# Patient Record
Sex: Male | Born: 1940
Health system: Southern US, Community
[De-identification: ages and names within clinical notes are randomized; demographics above are authoritative.]

## PROBLEM LIST (undated history)

## (undated) DIAGNOSIS — E781 Pure hyperglyceridemia: Secondary | ICD-10-CM

## (undated) DIAGNOSIS — F329 Major depressive disorder, single episode, unspecified: Secondary | ICD-10-CM

## (undated) DIAGNOSIS — F32A Depression, unspecified: Secondary | ICD-10-CM

## (undated) DIAGNOSIS — I10 Essential (primary) hypertension: Secondary | ICD-10-CM

## (undated) DIAGNOSIS — M549 Dorsalgia, unspecified: Secondary | ICD-10-CM

## (undated) DIAGNOSIS — G4730 Sleep apnea, unspecified: Secondary | ICD-10-CM

## (undated) DIAGNOSIS — R251 Tremor, unspecified: Secondary | ICD-10-CM

## (undated) DIAGNOSIS — E785 Hyperlipidemia, unspecified: Secondary | ICD-10-CM

## (undated) DIAGNOSIS — F0280 Dementia in other diseases classified elsewhere without behavioral disturbance: Secondary | ICD-10-CM

## (undated) DIAGNOSIS — G709 Myoneural disorder, unspecified: Secondary | ICD-10-CM

## (undated) DIAGNOSIS — G3183 Dementia with Lewy bodies: Secondary | ICD-10-CM

## (undated) DIAGNOSIS — M1990 Unspecified osteoarthritis, unspecified site: Secondary | ICD-10-CM

## (undated) DIAGNOSIS — C189 Malignant neoplasm of colon, unspecified: Secondary | ICD-10-CM

## (undated) DIAGNOSIS — G8929 Other chronic pain: Secondary | ICD-10-CM

## (undated) DIAGNOSIS — D649 Anemia, unspecified: Secondary | ICD-10-CM

## (undated) DIAGNOSIS — E039 Hypothyroidism, unspecified: Secondary | ICD-10-CM

## (undated) HISTORY — PX: APPENDECTOMY: SHX54

## (undated) HISTORY — DX: Tremor, unspecified: R25.1

## (undated) HISTORY — DX: Dementia in other diseases classified elsewhere, unspecified severity, without behavioral disturbance, psychotic disturbance, mood disturbance, and anxiety: F02.80

## (undated) HISTORY — PX: CATARACT EXTRACTION: SUR2

## (undated) HISTORY — PX: VASECTOMY: SHX75

## (undated) HISTORY — DX: Dementia with Lewy bodies: G31.83

## (undated) HISTORY — DX: Pure hyperglyceridemia: E78.1

## (undated) HISTORY — PX: KNEE SURGERY: SHX244

## (undated) HISTORY — PX: PORTACATH PLACEMENT: SHX2246

## (undated) HISTORY — DX: Hyperlipidemia, unspecified: E78.5

## (undated) HISTORY — PX: BACK SURGERY: SHX140

## (undated) HISTORY — PX: HERNIA REPAIR: SHX51

---

## 1997-09-18 DIAGNOSIS — C189 Malignant neoplasm of colon, unspecified: Secondary | ICD-10-CM

## 1997-09-18 HISTORY — DX: Malignant neoplasm of colon, unspecified: C18.9

## 1998-08-18 HISTORY — PX: COLON RESECTION: SHX5231

## 2012-06-25 ENCOUNTER — Other Ambulatory Visit (HOSPITAL_COMMUNITY): Payer: Self-pay | Admitting: Orthopaedic Surgery

## 2012-06-25 DIAGNOSIS — M25519 Pain in unspecified shoulder: Secondary | ICD-10-CM | POA: Diagnosis not present

## 2012-06-25 DIAGNOSIS — M25511 Pain in right shoulder: Secondary | ICD-10-CM

## 2012-06-26 ENCOUNTER — Ambulatory Visit (HOSPITAL_COMMUNITY)
Admission: RE | Admit: 2012-06-26 | Discharge: 2012-06-26 | Disposition: A | Discharge status: Home / Self Care | Payer: Managed Care, Other (non HMO) | Source: Ambulatory Visit | Attending: Orthopaedic Surgery | Admitting: Orthopaedic Surgery

## 2012-06-26 DIAGNOSIS — M249 Joint derangement, unspecified: Secondary | ICD-10-CM | POA: Insufficient documentation

## 2012-06-26 DIAGNOSIS — M24119 Other articular cartilage disorders, unspecified shoulder: Secondary | ICD-10-CM | POA: Diagnosis not present

## 2012-06-26 DIAGNOSIS — R937 Abnormal findings on diagnostic imaging of other parts of musculoskeletal system: Secondary | ICD-10-CM | POA: Insufficient documentation

## 2012-06-26 DIAGNOSIS — M25619 Stiffness of unspecified shoulder, not elsewhere classified: Secondary | ICD-10-CM | POA: Insufficient documentation

## 2012-06-26 DIAGNOSIS — M898X9 Other specified disorders of bone, unspecified site: Secondary | ICD-10-CM | POA: Insufficient documentation

## 2012-06-26 DIAGNOSIS — S4980XA Other specified injuries of shoulder and upper arm, unspecified arm, initial encounter: Secondary | ICD-10-CM | POA: Diagnosis not present

## 2012-06-26 DIAGNOSIS — M25519 Pain in unspecified shoulder: Secondary | ICD-10-CM | POA: Insufficient documentation

## 2012-06-26 DIAGNOSIS — S46909A Unspecified injury of unspecified muscle, fascia and tendon at shoulder and upper arm level, unspecified arm, initial encounter: Secondary | ICD-10-CM | POA: Diagnosis not present

## 2012-06-26 DIAGNOSIS — M25511 Pain in right shoulder: Secondary | ICD-10-CM

## 2012-06-28 DIAGNOSIS — M25519 Pain in unspecified shoulder: Secondary | ICD-10-CM | POA: Diagnosis not present

## 2012-07-03 DIAGNOSIS — M25519 Pain in unspecified shoulder: Secondary | ICD-10-CM | POA: Diagnosis not present

## 2012-07-08 DIAGNOSIS — M25519 Pain in unspecified shoulder: Secondary | ICD-10-CM | POA: Diagnosis not present

## 2012-07-10 DIAGNOSIS — M25519 Pain in unspecified shoulder: Secondary | ICD-10-CM | POA: Diagnosis not present

## 2012-07-15 DIAGNOSIS — M25519 Pain in unspecified shoulder: Secondary | ICD-10-CM | POA: Diagnosis not present

## 2012-07-17 DIAGNOSIS — M25519 Pain in unspecified shoulder: Secondary | ICD-10-CM | POA: Diagnosis not present

## 2012-07-26 DIAGNOSIS — M25519 Pain in unspecified shoulder: Secondary | ICD-10-CM | POA: Diagnosis not present

## 2012-08-02 DIAGNOSIS — Z23 Encounter for immunization: Secondary | ICD-10-CM | POA: Diagnosis not present

## 2012-08-23 DIAGNOSIS — M25519 Pain in unspecified shoulder: Secondary | ICD-10-CM | POA: Diagnosis not present

## 2012-09-20 DIAGNOSIS — M25519 Pain in unspecified shoulder: Secondary | ICD-10-CM | POA: Diagnosis not present

## 2012-10-20 ENCOUNTER — Emergency Department (HOSPITAL_COMMUNITY): Payer: Medicare Other

## 2012-10-20 ENCOUNTER — Emergency Department (INDEPENDENT_AMBULATORY_CARE_PROVIDER_SITE_OTHER)
Admission: EM | Admit: 2012-10-20 | Discharge: 2012-10-20 | Disposition: A | Discharge status: Nursing Home (Includes ICF) | Payer: Medicare Other | Source: Home / Self Care | Attending: Emergency Medicine | Admitting: Emergency Medicine

## 2012-10-20 ENCOUNTER — Encounter (HOSPITAL_COMMUNITY): Payer: Self-pay

## 2012-10-20 ENCOUNTER — Encounter (HOSPITAL_COMMUNITY): Payer: Self-pay | Admitting: Emergency Medicine

## 2012-10-20 ENCOUNTER — Emergency Department (HOSPITAL_COMMUNITY)
Admission: EM | Admit: 2012-10-20 | Discharge: 2012-10-20 | Disposition: A | Discharge status: Home / Self Care | Payer: Medicare Other | Attending: Emergency Medicine | Admitting: Emergency Medicine

## 2012-10-20 DIAGNOSIS — G479 Sleep disorder, unspecified: Secondary | ICD-10-CM | POA: Diagnosis not present

## 2012-10-20 DIAGNOSIS — Z9089 Acquired absence of other organs: Secondary | ICD-10-CM | POA: Insufficient documentation

## 2012-10-20 DIAGNOSIS — R635 Abnormal weight gain: Secondary | ICD-10-CM | POA: Insufficient documentation

## 2012-10-20 DIAGNOSIS — R141 Gas pain: Secondary | ICD-10-CM | POA: Diagnosis not present

## 2012-10-20 DIAGNOSIS — R143 Flatulence: Secondary | ICD-10-CM

## 2012-10-20 DIAGNOSIS — I700 Atherosclerosis of aorta: Secondary | ICD-10-CM | POA: Diagnosis not present

## 2012-10-20 DIAGNOSIS — R109 Unspecified abdominal pain: Secondary | ICD-10-CM

## 2012-10-20 DIAGNOSIS — K5900 Constipation, unspecified: Secondary | ICD-10-CM

## 2012-10-20 DIAGNOSIS — I10 Essential (primary) hypertension: Secondary | ICD-10-CM | POA: Insufficient documentation

## 2012-10-20 DIAGNOSIS — Z85038 Personal history of other malignant neoplasm of large intestine: Secondary | ICD-10-CM | POA: Diagnosis not present

## 2012-10-20 DIAGNOSIS — R0602 Shortness of breath: Secondary | ICD-10-CM | POA: Insufficient documentation

## 2012-10-20 DIAGNOSIS — R140 Abdominal distension (gaseous): Secondary | ICD-10-CM

## 2012-10-20 DIAGNOSIS — R142 Eructation: Secondary | ICD-10-CM | POA: Diagnosis not present

## 2012-10-20 HISTORY — DX: Malignant neoplasm of colon, unspecified: C18.9

## 2012-10-20 HISTORY — DX: Essential (primary) hypertension: I10

## 2012-10-20 LAB — COMPREHENSIVE METABOLIC PANEL
AST: 29 U/L (ref 0–37)
Albumin: 4.7 g/dL (ref 3.5–5.2)
BUN: 12 mg/dL (ref 6–23)
Calcium: 9.7 mg/dL (ref 8.4–10.5)
Creatinine, Ser: 0.7 mg/dL (ref 0.50–1.35)
Total Protein: 8 g/dL (ref 6.0–8.3)

## 2012-10-20 LAB — CBC WITH DIFFERENTIAL/PLATELET
Basophils Absolute: 0.1 10*3/uL (ref 0.0–0.1)
Basophils Relative: 1 % (ref 0–1)
Eosinophils Absolute: 0.1 10*3/uL (ref 0.0–0.7)
Eosinophils Relative: 3 % (ref 0–5)
HCT: 36.7 % — ABNORMAL LOW (ref 39.0–52.0)
MCH: 30.6 pg (ref 26.0–34.0)
MCHC: 35.1 g/dL (ref 30.0–36.0)
MCV: 87.2 fL (ref 78.0–100.0)
Monocytes Absolute: 0.2 10*3/uL (ref 0.1–1.0)
Monocytes Relative: 5 % (ref 3–12)
Neutro Abs: 2.5 10*3/uL (ref 1.7–7.7)
RDW: 13.4 % (ref 11.5–15.5)

## 2012-10-20 LAB — PRO B NATRIURETIC PEPTIDE: Pro B Natriuretic peptide (BNP): 125.4 pg/mL — ABNORMAL HIGH (ref 0–125)

## 2012-10-20 MED ORDER — POLYETHYLENE GLYCOL 3350 17 GM/SCOOP PO POWD: 17.0000 g | Freq: Every day | ORAL | Status: DC

## 2012-10-20 MED ORDER — IOHEXOL 300 MG/ML  SOLN
50.0000 mL | Freq: Once | INTRAMUSCULAR | Status: AC | PRN
  Administered 2012-10-20: 50 mL via ORAL

## 2012-10-20 MED ORDER — IOHEXOL 300 MG/ML  SOLN
100.0000 mL | Freq: Once | INTRAMUSCULAR | Status: AC | PRN
  Administered 2012-10-20: 100 mL via INTRAVENOUS

## 2012-10-20 MED ORDER — DICYCLOMINE HCL 20 MG PO TABS: 20.0000 mg | ORAL_TABLET | Freq: Two times a day (BID) | ORAL | Status: DC

## 2012-10-20 NOTE — ED Provider Notes (Addendum)
History     CSN: [id number]  Arrival date & time 10/20/12  1227   First MD Initiated Contact with Patient 10/20/12 1236      Chief Complaint  Patient presents with  . Abdominal Pain    (Consider location/radiation/quality/duration/timing/severity/associated sxs/prior treatment) HPI Comments: Patient presents to urgent care this afternoon complaining of ongoing abdominal pain and distention that has been getting worse in the last 2 months. Patient describes that he thinks he has gained approximately 20 pounds in the last 2 months. And is now having difficulty breathing when he lays flat and at night. Patient was diagnosed with colon cancer in 2000 requiring surgery and chemotherapy and admits that have not followup in the last few years as it Dr. told him to do so. Patient denies any nausea vomiting or diarrheas or changes in appetite or bowel movements. " My abdomen has more than doubling in size since in the last 2 months, and now is worse".   Patient is a 72 y.o. male presenting with abdominal pain. The history is provided by the patient and the spouse.  Abdominal Pain The primary symptoms of the illness include abdominal pain and shortness of breath. The primary symptoms of the illness do not include fever, fatigue, nausea, vomiting, hematochezia or dysuria. The onset of the illness was gradual. The problem has been gradually worsening.  The illness is associated with awakening from sleep (Positional dyspnea). The patient has not had a change in bowel habit. Symptoms associated with the illness do not include chills, anorexia or diaphoresis.    History reviewed. No pertinent past medical history.  History reviewed. No pertinent past surgical history.  No family history on file.  History  Substance Use Topics  . Smoking status: Not on file  . Smokeless tobacco: Not on file  . Alcohol Use: Not on file      Review of Systems  Constitutional: Positive for activity change and  unexpected weight change. Negative for fever, chills, diaphoresis, appetite change and fatigue.  Respiratory: Positive for shortness of breath. Negative for cough, choking, chest tightness, wheezing and stridor.   Cardiovascular: Negative for chest pain.  Gastrointestinal: Positive for abdominal pain. Negative for nausea, vomiting, hematochezia and anorexia.  Genitourinary: Negative for dysuria.  Skin: Negative for rash.  Neurological: Negative for dizziness, light-headedness and headaches.    Allergies  Review of patient's allergies indicates no known allergies.  Home Medications  No current outpatient prescriptions on file.  BP 201/84  Pulse 60  Temp 97.9 F (36.6 C) (Oral)  Resp 18  SpO2 100%  Physical Exam  Nursing note and vitals reviewed. Constitutional: He appears distressed.  Eyes: Conjunctivae normal are normal. Pupils are equal, round, and reactive to light. No scleral icterus.  Neck: Neck supple.  Cardiovascular: Normal rate.  Exam reveals no gallop and no friction rub.   No murmur heard. Pulmonary/Chest: Effort normal and breath sounds normal.  Abdominal: He exhibits distension. He exhibits no mass. There is no hepatosplenomegaly, splenomegaly or hepatomegaly. There is tenderness. There is no rigidity, no rebound, no guarding, no CVA tenderness, no tenderness at McBurney's point and negative Murphy's sign.    Neurological: He is alert.  Skin: No rash noted. No erythema.    ED Course  Procedures (including critical care time)  Labs Reviewed - No data to display No results found.   1. Abdominal pain   2. Abdominal distension       MDM   72 year old male, presents to urgent care  symptomatic for 2 months. Includes weight gain of approximately 20 pounds and abdominal distention. Patient is in discomfort, and is having some mechanical dyspnea exerted by abdominal distention. Etiology of such are several, including partial intestinal obstruction ( previous  surgery), ascites secondary to recurrence he of colon cancer?  Patient will be transferred to the emergency department to be consider for further evaluation and perhaps abdominal imaging.        Jimmie Molly, MD 10/20/12 1505  Jimmie Molly, MD 10/20/12 [phone]

## 2012-10-20 NOTE — ED Notes (Signed)
Pt sent from urgent care for further eval of abdominal distension and weight gain. Pt reports symptoms ongoing x 2-3 months. Pt denies pain, nausea or vomiting. Pt gained about 52-58 pounds in 6 months.

## 2012-10-20 NOTE — ED Notes (Signed)
Pt had colon resection and chemotherapy (stage 3 cancer) in 1999.  Followed up with colonoscopies for 5 years afterward/no follow up since 2004

## 2012-10-20 NOTE — ED Notes (Signed)
C/o worsening abdominal pain, sx started approx 2 months ago , states that he has difficulty breathing at night

## 2012-10-20 NOTE — ED Provider Notes (Signed)
History  This chart was scribed for Loren Racer, MD by Shari Heritage, ED Scribe. The patient was seen in room CD05C/CD05C. Patient's care was started at 1609.  CSN: [id number]  Arrival date & time 10/20/12  1545   First MD Initiated Contact with Patient 10/20/12 1609      Chief Complaint  Patient presents with  . Weight Gain  . Bloated     Patient is a 72 y.o. male presenting with abdominal pain. The history is provided by the patient. No language interpreter was used.  Abdominal Pain Primary symptoms comment: Abdominal distension The current episode started more than 2 days ago. The onset of the illness was gradual. The problem has not changed since onset.    HPI Comments: John Molina is a 72 y.o. male who presents to the Emergency Department complaining of abdominal distension onset 2-3 months ago.Patient states that he has gained about 50 lbs in the past 6 months. He has a history of colon cancer and colon resection. He states that this procedure was performed in [place] and he does not have a gastroenterologist or oncologist in the area as he just moved here recently. Patient was seen earlier today at [facility] about this problem and was transferred for further evaluation and abdominal imaging. Patient also reports shortness of breath at night. He states the he is having increased discomfort and difficulty sleeping at night. Patient denies nausea, vomiting, diarrhea, constipation, fever or chills. Other medical history includes hypertension.  Past Medical History  Diagnosis Date  . Hypertension   . Colon cancer     Past Surgical History  Procedure Date  . Colon resection 08/1998  . Hernia repair   . Appendectomy   . Knee surgery   . Vasectomy     No family history on file.  History  Substance Use Topics  . Smoking status: Never Smoker   . Smokeless tobacco: Not on file  . Alcohol Use: No      Review of Systems  Gastrointestinal: Positive for abdominal  distention.  All other systems reviewed and are negative.    Allergies  Review of patient's allergies indicates no known allergies.  Home Medications   Current Outpatient Rx  Name  Route  Sig  Dispense  Refill  . NAPROXEN 250 MG PO TABS   Oral   Take 250 mg by mouth 2 (two) times daily as needed. For pain         . DICYCLOMINE HCL 20 MG PO TABS   Oral   Take 1 tablet (20 mg total) by mouth 2 (two) times daily.   20 tablet   0   . POLYETHYLENE GLYCOL 3350 PO POWD   Oral   Take 17 g by mouth daily.   255 g   0     Triage Vitals: BP 186/76  Pulse 56  Temp 97.4 F (36.3 C) (Oral)  Resp 18  Wt 185 lb (83.915 kg)  SpO2 99%  Physical Exam  Constitutional: He is oriented to person, place, and time. He appears well-developed and well-nourished.  HENT:  Head: Normocephalic and atraumatic.  Mouth/Throat: Oropharynx is clear and moist.  Eyes: Conjunctivae normal and EOM are normal. Pupils are equal, round, and reactive to light.  Neck: Normal range of motion. Neck supple.  Cardiovascular: Normal rate, regular rhythm and normal heart sounds.   Pulmonary/Chest: Effort normal and breath sounds normal. No respiratory distress. He has no wheezes. He has no rales.  Abdominal: Bowel  sounds are normal. He exhibits distension.       No focal tenderness to palpation.  Musculoskeletal: Normal range of motion. He exhibits no edema and no tenderness.       No pedal edema.  Neurological: He is alert and oriented to person, place, and time.  Skin: Skin is warm and dry.    ED Course  Procedures (including critical care time) DIAGNOSTIC STUDIES: Oxygen Saturation is 99% on room air, normal by my interpretation.    COORDINATION OF CARE: 4:37 PM- Patient informed of current plan for treatment and evaluation and agrees with plan at this time.    Labs Reviewed  CBC WITH DIFFERENTIAL - Abnormal; Notable for the following:    RBC 4.21 (*)     Hemoglobin 12.9 (*)     HCT 36.7 (*)      All other components within normal limits  PRO B NATRIURETIC PEPTIDE - Abnormal; Notable for the following:    Pro B Natriuretic peptide (BNP) 125.4 (*)     All other components within normal limits  COMPREHENSIVE METABOLIC PANEL  LIPASE, BLOOD    Dg Chest 2 View  10/20/2012  *RADIOLOGY REPORT*  Clinical Data: Shortness of breath, hypertension, history of colon cancer  CHEST - 2 VIEW  Comparison: None.  Findings:  Normal cardiac silhouette and mediastinal contours.  There is minimal perihilar interstitial thickening.  No focal airspace opacities.  Nodular opacity overlying the left lower lung is favored to represent a nipple shadow.  No focal airspace opacities. No pleural effusion or pneumothorax.  DISH within the thoracic spine is suspected.  IMPRESSION: Findings suggestive of airways disease/bronchitis.  No focal airspace opacities to suggest pneumonia.   Original Report Authenticated By: Tacey Ruiz, MD    Ct Abdomen Pelvis W Contrast  10/20/2012  *RADIOLOGY REPORT*  Clinical Data: Abdominal distention.  Weight gain.  History of colon cancer with colon resection.  Appendectomy.  CT ABDOMEN AND PELVIS WITH CONTRAST  Technique:  Multidetector CT imaging of the abdomen and pelvis was performed following the standard protocol during bolus administration of intravenous contrast.  Contrast: OMNIPAQUE IOHEXOL 300 MG/ML  SOLN  Comparison: None.  Findings: Lung Bases: Dependent atelectasis.  Liver:  Normal.  Spleen:  Normal.  Gallbladder:  Normal.  Common bile duct:  Normal.  Pancreas:  Normal.  Adrenal glands:  Normal.  Kidneys:  Normal enhancement and excretion of contrast.  5 mm right inferior pole low density lesion likely represents a tiny renal cyst.  The ureters appear within normal limits.  Stomach:  Normal.  Small bowel:  Normal.  No obstruction.  No mesenteric adenopathy. No free air or free fluid.  Colon:   Cecum and ascending colon appear normal.  No right lower quadrant inflammatory  changes.  Colon is distended with stool. Surgical staple line present at the rectosigmoid junction.  Pelvic Genitourinary:  Normal.  No free fluid.  Bones:  Normal.  Lumbar spondylosis and scattered Schmorl's nodes.  Vasculature: Mild abdominal aortic atherosclerosis without aneurysm.  IMPRESSION:  1.  No acute abnormality. 2.  Partial sigmoid resection with staple line at the junction of the rectum and sigmoid.   Original Report Authenticated By: Andreas Newport, M.D.      1. Constipation       MDM  I personally performed the services described in this documentation, which was scribed in my presence. The recorded information has been reviewed and is accurate.    Loren Racer, MD 10/20/12 [phone]

## 2012-10-21 DIAGNOSIS — M25519 Pain in unspecified shoulder: Secondary | ICD-10-CM | POA: Diagnosis not present

## 2012-11-04 ENCOUNTER — Encounter (HOSPITAL_COMMUNITY)
Admission: RE | Admit: 2012-11-04 | Discharge: 2012-11-04 | Disposition: A | Discharge status: Home / Self Care | Payer: Medicare Other | Source: Ambulatory Visit | Attending: Orthopaedic Surgery | Admitting: Orthopaedic Surgery

## 2012-11-04 ENCOUNTER — Encounter (HOSPITAL_COMMUNITY): Payer: Self-pay | Admitting: Pharmacy Technician

## 2012-11-04 ENCOUNTER — Encounter (HOSPITAL_COMMUNITY): Payer: Self-pay

## 2012-11-04 DIAGNOSIS — S43429A Sprain of unspecified rotator cuff capsule, initial encounter: Secondary | ICD-10-CM | POA: Diagnosis not present

## 2012-11-04 DIAGNOSIS — I10 Essential (primary) hypertension: Secondary | ICD-10-CM | POA: Diagnosis not present

## 2012-11-04 DIAGNOSIS — Z01810 Encounter for preprocedural cardiovascular examination: Secondary | ICD-10-CM | POA: Diagnosis not present

## 2012-11-04 DIAGNOSIS — Z01812 Encounter for preprocedural laboratory examination: Secondary | ICD-10-CM | POA: Diagnosis not present

## 2012-11-04 DIAGNOSIS — Z23 Encounter for immunization: Secondary | ICD-10-CM | POA: Diagnosis not present

## 2012-11-04 DIAGNOSIS — M25519 Pain in unspecified shoulder: Secondary | ICD-10-CM | POA: Diagnosis not present

## 2012-11-04 HISTORY — DX: Unspecified osteoarthritis, unspecified site: M19.90

## 2012-11-04 HISTORY — DX: Dorsalgia, unspecified: M54.9

## 2012-11-04 HISTORY — DX: Anemia, unspecified: D64.9

## 2012-11-04 HISTORY — DX: Other chronic pain: G89.29

## 2012-11-04 LAB — URINALYSIS, ROUTINE W REFLEX MICROSCOPIC
Bilirubin Urine: NEGATIVE
Glucose, UA: NEGATIVE mg/dL
Hgb urine dipstick: NEGATIVE
Specific Gravity, Urine: >1.03 — ABNORMAL HIGH (ref 1.005–1.030)
Urobilinogen, UA: 0.2 mg/dL (ref 0.0–1.0)

## 2012-11-04 LAB — SURGICAL PCR SCREEN
MRSA, PCR: NEGATIVE
Staphylococcus aureus: POSITIVE — AB

## 2012-11-04 LAB — URINE MICROSCOPIC-ADD ON

## 2012-11-04 NOTE — Patient Instructions (Addendum)
John Molina  11/04/2012   Your procedure is scheduled on:   11/07/2012  Report to [facility] at  615  AM.  Call this number if you have problems the morning of surgery: [phone]   Remember:   Do not eat food or drink liquids after midnight.   Take these medicines the morning of surgery with A SIP OF WATER: none   Do not wear jewelry, make-up or nail polish.  Do not wear lotions, powders, or perfumes.   Do not shave 48 hours prior to surgery. Men may shave face and neck.  Do not bring valuables to the hospital.  Contacts, dentures or bridgework may not be worn into surgery.  Leave suitcase in the car. After surgery it may be brought to your room.  For patients admitted to the hospital, checkout time is 11:00 AM the day of discharge.   Patients discharged the day of surgery will not be allowed to drive home.  Name and phone number of your driver: family  Special Instructions: Shower using CHG 2 nights before surgery and the night before surgery.  If you shower the day of surgery use CHG.  Use special wash - you have one bottle of CHG for all showers.  You should use approximately 1/3 of the bottle for each shower.   Please read over the following fact sheets that you were given: Pain Booklet, Coughing and Deep Breathing, MRSA Information, Surgical Site Infection Prevention, Anesthesia Post-op Instructions and Care and Recovery After Surgery Rotator Cuff Injury The rotator cuff is the collective set of muscles and tendons that make up the stabilizing unit of your shoulder. This unit holds in the ball of the humerus (upper arm bone) in the socket of the scapula (shoulder blade). Injuries to this stabilizing unit most commonly come from sports or activities that cause the arm to be moved repeatedly over the head. Examples of this include throwing, weight lifting, swimming, racquet sports, or an injury such as falling on your arm. Chronic (longstanding) irritation of this unit can  cause inflammation (soreness), bursitis, and eventual damage to the tendons to the point of rupture (tear). An acute (sudden) injury of the rotator cuff can result in a partial or complete tear. You may need surgery with complete tears. Small or partial rotator cuff tears may be treated conservatively with temporary immobilization, exercises and rest. Physical therapy may be needed. HOME CARE INSTRUCTIONS   Apply ice to the injury for 15 to 20 minutes 3 to 4 times per day for the first 2 days. Put the ice in a plastic bag and place a towel between the bag of ice and your skin.  If you have a shoulder immobilizer (sling and straps), do not remove it for as long as directed by your caregiver or until you see a caregiver for a follow-up examination. If you need to remove it, move your arm as little as possible.  You may want to sleep on several pillows or in a recliner at night to lessen swelling and pain.  Only take over-the-counter or prescription medicines for pain, discomfort, or fever as directed by your caregiver.  Do simple hand squeezing exercises with a soft rubber ball to decrease hand swelling. SEEK MEDICAL CARE IF:   Pain in your shoulder increases or new pain or numbness develops in your arm, hand, or fingers.  Your hand or fingers are colder than your other hand. SEEK IMMEDIATE MEDICAL CARE IF:  Your arm, hand, or fingers are numb or tingling.  Your arm, hand, or fingers are increasingly swollen and painful, or turn white or blue. Document Released: 09/01/2000 Document Revised: 11/27/2011 Document Reviewed: 08/25/2008 [facility] Patient Information 2013 [place], [place]. PATIENT INSTRUCTIONS POST-ANESTHESIA  IMMEDIATELY FOLLOWING SURGERY:  Do not drive or operate machinery for the first twenty four hours after surgery.  Do not make any important decisions for twenty four hours after surgery or while taking narcotic pain medications or sedatives.  If you develop intractable nausea  and vomiting or a severe headache please notify your doctor immediately.  FOLLOW-UP:  Please make an appointment with your surgeon as instructed. You do not need to follow up with anesthesia unless specifically instructed to do so.  WOUND CARE INSTRUCTIONS (if applicable):  Keep a dry clean dressing on the anesthesia/puncture wound site if there is drainage.  Once the wound has quit draining you may leave it open to air.  Generally you should leave the bandage intact for twenty four hours unless there is drainage.  If the epidural site drains for more than 36-48 hours please call the anesthesia department.  QUESTIONS?:  Please feel free to call your physician or the hospital operator if you have any questions, and they will be happy to assist you.

## 2012-11-06 ENCOUNTER — Encounter (HOSPITAL_COMMUNITY): Payer: Self-pay | Admitting: Orthopaedic Surgery

## 2012-11-06 NOTE — OR Nursing (Signed)
Dr. Hilda Lias office called to remind him that we need H&P in morning.  Spoke with [employer].

## 2012-11-06 NOTE — H&P (Signed)
John Molina is an 72 y.o. male.   Chief Complaint: Right shoulder pain. HPI: He hurt his right shoulder using a jack at his home in late September 2013.  I first saw him in my office 06/25/12.  I was concerned about a rotator cuff tear.  He had a MRI done on 06/26/12 showing partial thickness articular surface tear of distal anterior supraspinatus tendon with moderated supraspinatus tendinopathy and edema.  He had subscapularis tendinopathy and prominent degenerative AC joint arthropathy and impingement on the supraspinatus tendon.  He had changes of the long head of the biceps tendon.  I informed him of the findings and he began a course of physical therapy and NSAIDs.  He improved for a while but the pain has returned and he is tired of it hurting all the time.  He would like surgery on the shoulder.  I have gone over the risks and imponderables of the procedure with him.  He understands that even after the surgery he may have some pain and some deficit.  He has history of chronic pain of the lower back and has been on oxycodone for some time.  He may need large doses of pain medicine for a short time post operative. He will need physical therapy again post operative.  He says he understands and accepts the procedure.  I plan to admit to observation status post operative to monitor his pain. Past Medical History  Diagnosis Date  . Hypertension   . Colon cancer   . Arthritis   . Hepatitis     Hep C  . Anemia   . Chronic back pain     Past Surgical History  Procedure Laterality Date  . Colon resection  08/1998  . Hernia repair    . Appendectomy    . Knee surgery    . Vasectomy    . Cataract extraction      bilateral    No family history on file. Social History:  reports that he has never smoked. He does not have any smokeless tobacco history on file. He reports that he does not drink alcohol or use illicit drugs.  Allergies: No Known Allergies  No prescriptions prior to admission     Results for orders placed during the hospital encounter of 11/04/12 (from the past 48 hour(s))  URINALYSIS, ROUTINE W REFLEX MICROSCOPIC     Status: Abnormal   Collection Time    11/04/12  1:41 PM      Result Value Range   Color, Urine YELLOW  YELLOW   APPearance CLEAR  CLEAR   Specific Gravity, Urine >1.030 (*) 1.005 - 1.030   pH 5.5  5.0 - 8.0   Glucose, UA NEGATIVE  NEGATIVE mg/dL   Hgb urine dipstick NEGATIVE  NEGATIVE   Bilirubin Urine NEGATIVE  NEGATIVE   Ketones, ur NEGATIVE  NEGATIVE mg/dL   Protein, ur 30 (*) NEGATIVE mg/dL   Urobilinogen, UA 0.2  0.0 - 1.0 mg/dL   Nitrite NEGATIVE  NEGATIVE   Leukocytes, UA NEGATIVE  NEGATIVE  SURGICAL PCR SCREEN     Status: Abnormal   Collection Time    11/04/12  1:41 PM      Result Value Range   MRSA, PCR NEGATIVE  NEGATIVE   Staphylococcus aureus POSITIVE (*) NEGATIVE   Comment:            The Xpert SA Assay (FDA     approved for NASAL specimens     in patients over  18 years of age),     is one component of     a comprehensive surveillance     program.  Test performance has     been validated by [employer] for patients greater     than or equal to 55 year old.     It is not intended     to diagnose infection nor to     guide or monitor treatment.     RESULT CALLED TO, READ BACK BY AND VERIFIED WITH:     LEFT MESSAGE FOR KIM FAINT AT 1808 ON 11/04/2012 BY BAUGHAM,M.  URINE MICROSCOPIC-ADD ON     Status: Abnormal   Collection Time    11/04/12  1:41 PM      Result Value Range   Squamous Epithelial / LPF RARE  RARE   WBC, UA 0-2  <3 WBC/hpf   Bacteria, UA RARE  RARE   Casts HYALINE CASTS (*) NEGATIVE   No results found.  Review of Systems  Gastrointestinal:       History of colon cancer.  Musculoskeletal: Positive for back pain (History of chronic back pain, two surgeries.  He is on chronic narcotics for this.) and joint pain (Pain right shoulder since the end of September 2013.).  All other systems reviewed  and are negative.    There were no vitals taken for this visit. Physical Exam  Constitutional: He is oriented to person, place, and time. He appears well-developed and well-nourished.  HENT:  Head: Normocephalic and atraumatic.  Eyes: Conjunctivae and EOM are normal. Pupils are equal, round, and reactive to light.  Neck: Neck supple.  Cardiovascular: Normal rate, regular rhythm, normal heart sounds and intact distal pulses.   Respiratory: Effort normal and breath sounds normal.  GI: Soft. Bowel sounds are normal.  Musculoskeletal: He exhibits tenderness (Pain of the right shoulder.  He has pain in the extremes, more with overhead use and abduction.  Range is full but painful.  He has slight crepitus.  He has no numbness.).  Neurological: He is alert and oriented to person, place, and time. He has normal reflexes.  Skin: Skin is warm and dry.  Psychiatric: He has a normal mood and affect. His behavior is normal. Judgment and thought content normal.     Assessment/Plan Rotator cuff tear of the right shoulder.  For open repair. Admit to observation post operative.   John Molina 11/06/2012, 11:31 AM

## 2012-11-07 ENCOUNTER — Ambulatory Visit (HOSPITAL_COMMUNITY): Payer: Medicare Other | Admitting: Anesthesiology

## 2012-11-07 ENCOUNTER — Encounter (HOSPITAL_COMMUNITY): Payer: Self-pay | Admitting: Anesthesiology

## 2012-11-07 ENCOUNTER — Encounter (HOSPITAL_COMMUNITY)
Admission: RE | Disposition: A | Discharge status: Home / Self Care | Payer: Self-pay | Source: Ambulatory Visit | Attending: Orthopaedic Surgery

## 2012-11-07 ENCOUNTER — Encounter (HOSPITAL_COMMUNITY): Payer: Self-pay | Admitting: *Deleted

## 2012-11-07 ENCOUNTER — Observation Stay (HOSPITAL_COMMUNITY)
Admission: RE | Admit: 2012-11-07 | Discharge: 2012-11-09 | Disposition: A | Discharge status: Home / Self Care | Payer: Medicare Other | Source: Ambulatory Visit | Attending: Orthopaedic Surgery | Admitting: Orthopaedic Surgery

## 2012-11-07 DIAGNOSIS — D1600 Benign neoplasm of scapula and long bones of unspecified upper limb: Secondary | ICD-10-CM | POA: Diagnosis not present

## 2012-11-07 DIAGNOSIS — Z01812 Encounter for preprocedural laboratory examination: Secondary | ICD-10-CM | POA: Insufficient documentation

## 2012-11-07 DIAGNOSIS — Z23 Encounter for immunization: Secondary | ICD-10-CM | POA: Diagnosis not present

## 2012-11-07 DIAGNOSIS — X58XXXA Exposure to other specified factors, initial encounter: Secondary | ICD-10-CM | POA: Insufficient documentation

## 2012-11-07 DIAGNOSIS — Z01810 Encounter for preprocedural cardiovascular examination: Secondary | ICD-10-CM | POA: Diagnosis not present

## 2012-11-07 DIAGNOSIS — S43429A Sprain of unspecified rotator cuff capsule, initial encounter: Principal | ICD-10-CM | POA: Insufficient documentation

## 2012-11-07 DIAGNOSIS — D2110 Benign neoplasm of connective and other soft tissue of unspecified upper limb, including shoulder: Secondary | ICD-10-CM | POA: Diagnosis not present

## 2012-11-07 DIAGNOSIS — M75101 Unspecified rotator cuff tear or rupture of right shoulder, not specified as traumatic: Secondary | ICD-10-CM

## 2012-11-07 DIAGNOSIS — M75120 Complete rotator cuff tear or rupture of unspecified shoulder, not specified as traumatic: Secondary | ICD-10-CM | POA: Diagnosis not present

## 2012-11-07 DIAGNOSIS — M12811 Other specific arthropathies, not elsewhere classified, right shoulder: Secondary | ICD-10-CM

## 2012-11-07 DIAGNOSIS — I10 Essential (primary) hypertension: Secondary | ICD-10-CM | POA: Insufficient documentation

## 2012-11-07 HISTORY — PX: SHOULDER OPEN ROTATOR CUFF REPAIR: SHX2407

## 2012-11-07 HISTORY — PX: SHOULDER ACROMIOPLASTY: SHX6093

## 2012-11-07 SURGERY — REPAIR, ROTATOR CUFF, OPEN
Anesthesia: General | Site: Shoulder | Laterality: Right | Wound class: Clean

## 2012-11-07 MED ORDER — HYDROMORPHONE HCL PF 1 MG/ML IJ SOLN
INTRAMUSCULAR | Status: AC
  Filled 2012-11-07: qty 1

## 2012-11-07 MED ORDER — FENTANYL CITRATE 0.05 MG/ML IJ SOLN
INTRAMUSCULAR | Status: DC | PRN
  Administered 2012-11-07 (×2): 50 ug via INTRAVENOUS
  Administered 2012-11-07: 100 ug via INTRAVENOUS
  Administered 2012-11-07: 50 ug via INTRAVENOUS

## 2012-11-07 MED ORDER — DIPHENHYDRAMINE HCL 12.5 MG/5ML PO ELIX: 12.5000 mg | ORAL_SOLUTION | Freq: Four times a day (QID) | ORAL | Status: DC | PRN

## 2012-11-07 MED ORDER — ONDANSETRON HCL 4 MG/2ML IJ SOLN
4.0000 mg | Freq: Once | INTRAMUSCULAR | Status: AC
  Administered 2012-11-07: 4 mg via INTRAVENOUS

## 2012-11-07 MED ORDER — ZOLPIDEM TARTRATE 5 MG PO TABS
5.0000 mg | ORAL_TABLET | Freq: Every day | ORAL | Status: DC
  Administered 2012-11-07 – 2012-11-08 (×2): 5 mg via ORAL
  Filled 2012-11-07 (×2): qty 1

## 2012-11-07 MED ORDER — ROCURONIUM BROMIDE 50 MG/5ML IV SOLN
INTRAVENOUS | Status: AC
  Filled 2012-11-07: qty 1

## 2012-11-07 MED ORDER — SUFENTANIL CITRATE 50 MCG/ML IV SOLN
INTRAVENOUS | Status: DC | PRN
  Administered 2012-11-07: 5 ug via INTRAVENOUS
  Administered 2012-11-07: 10 ug via INTRAVENOUS
  Administered 2012-11-07: 5 ug via INTRAVENOUS
  Administered 2012-11-07 (×2): 10 ug via INTRAVENOUS
  Administered 2012-11-07 (×2): 5 ug via INTRAVENOUS

## 2012-11-07 MED ORDER — NEOSTIGMINE METHYLSULFATE 1 MG/ML IJ SOLN
INTRAMUSCULAR | Status: DC | PRN
  Administered 2012-11-07: 2 mg via INTRAVENOUS

## 2012-11-07 MED ORDER — MIDAZOLAM HCL 2 MG/2ML IJ SOLN
1.0000 mg | INTRAMUSCULAR | Status: DC | PRN
  Administered 2012-11-07: 2 mg via INTRAVENOUS

## 2012-11-07 MED ORDER — ENOXAPARIN SODIUM 40 MG/0.4ML ~~LOC~~ SOLN
40.0000 mg | SUBCUTANEOUS | Status: DC
  Administered 2012-11-08 – 2012-11-09 (×2): 40 mg via SUBCUTANEOUS
  Filled 2012-11-07 (×2): qty 0.4

## 2012-11-07 MED ORDER — ALBUTEROL SULFATE (5 MG/ML) 0.5% IN NEBU
2.5000 mg | INHALATION_SOLUTION | Freq: Four times a day (QID) | RESPIRATORY_TRACT | Status: DC
  Administered 2012-11-07 – 2012-11-08 (×2): 2.5 mg via RESPIRATORY_TRACT
  Filled 2012-11-07 (×2): qty 0.5

## 2012-11-07 MED ORDER — LIDOCAINE HCL (CARDIAC) 10 MG/ML IV SOLN
INTRAVENOUS | Status: DC | PRN
  Administered 2012-11-07: 20 mg via INTRAVENOUS

## 2012-11-07 MED ORDER — GLYCOPYRROLATE 0.2 MG/ML IJ SOLN
INTRAMUSCULAR | Status: DC | PRN
  Administered 2012-11-07: 0.4 mg via INTRAVENOUS

## 2012-11-07 MED ORDER — HYDROMORPHONE 0.3 MG/ML IV SOLN
INTRAVENOUS | Status: DC
  Administered 2012-11-07: 12:00:00 via INTRAVENOUS
  Administered 2012-11-07: 2.21 mg via INTRAVENOUS
  Administered 2012-11-07: 6 mg via INTRAVENOUS
  Administered 2012-11-07: 19:00:00 via INTRAVENOUS
  Administered 2012-11-08: 1.5 mg via INTRAVENOUS
  Administered 2012-11-08: 4.5 mg via INTRAVENOUS
  Filled 2012-11-07 (×3): qty 25

## 2012-11-07 MED ORDER — PROPOFOL 10 MG/ML IV BOLUS
INTRAVENOUS | Status: DC | PRN
  Administered 2012-11-07: 150 mg via INTRAVENOUS

## 2012-11-07 MED ORDER — LACTATED RINGERS IV SOLN
INTRAVENOUS | Status: DC
  Administered 2012-11-07: 1000 mL via INTRAVENOUS
  Administered 2012-11-07: 09:00:00 via INTRAVENOUS

## 2012-11-07 MED ORDER — ONDANSETRON HCL 4 MG/2ML IJ SOLN
4.0000 mg | Freq: Four times a day (QID) | INTRAMUSCULAR | Status: DC | PRN
  Administered 2012-11-08: 4 mg via INTRAVENOUS
  Filled 2012-11-07: qty 2

## 2012-11-07 MED ORDER — OXYMETAZOLINE HCL 0.05 % NA SOLN
2.0000 | Freq: Two times a day (BID) | NASAL | Status: DC
  Administered 2012-11-07 – 2012-11-08 (×3): 2 via NASAL
  Filled 2012-11-07: qty 15

## 2012-11-07 MED ORDER — HYDROMORPHONE HCL PF 1 MG/ML IJ SOLN
0.2500 mg | INTRAMUSCULAR | Status: DC | PRN
  Administered 2012-11-07 (×2): 0.5 mg via INTRAVENOUS

## 2012-11-07 MED ORDER — ACETAMINOPHEN 325 MG PO TABS
650.0000 mg | ORAL_TABLET | Freq: Four times a day (QID) | ORAL | Status: DC | PRN
  Administered 2012-11-08: 650 mg via ORAL
  Filled 2012-11-07: qty 2

## 2012-11-07 MED ORDER — POLYETHYLENE GLYCOL 3350 17 G PO PACK
17.0000 g | PACK | Freq: Every day | ORAL | Status: DC
  Administered 2012-11-08 – 2012-11-09 (×2): 17 g via ORAL
  Filled 2012-11-07 (×2): qty 1

## 2012-11-07 MED ORDER — MAGNESIUM HYDROXIDE 400 MG/5ML PO SUSP: 30.0000 mL | Freq: Every day | ORAL | Status: DC | PRN

## 2012-11-07 MED ORDER — KETOROLAC TROMETHAMINE 30 MG/ML IJ SOLN
INTRAMUSCULAR | Status: AC
  Filled 2012-11-07: qty 1

## 2012-11-07 MED ORDER — DIPHENHYDRAMINE HCL 50 MG/ML IJ SOLN: 12.5000 mg | Freq: Four times a day (QID) | INTRAMUSCULAR | Status: DC | PRN

## 2012-11-07 MED ORDER — PNEUMOCOCCAL VAC POLYVALENT 25 MCG/0.5ML IJ INJ
0.5000 mL | INJECTION | INTRAMUSCULAR | Status: AC
  Administered 2012-11-08: 0.5 mL via INTRAMUSCULAR
  Filled 2012-11-07: qty 0.5

## 2012-11-07 MED ORDER — CEFAZOLIN SODIUM-DEXTROSE 2-3 GM-% IV SOLR
2.0000 g | Freq: Once | INTRAVENOUS | Status: AC
  Administered 2012-11-07: 2 g via INTRAVENOUS

## 2012-11-07 MED ORDER — LIDOCAINE HCL (PF) 1 % IJ SOLN
INTRAMUSCULAR | Status: AC
  Filled 2012-11-07: qty 5

## 2012-11-07 MED ORDER — FENTANYL CITRATE 0.05 MG/ML IJ SOLN
INTRAMUSCULAR | Status: AC
  Filled 2012-11-07: qty 5

## 2012-11-07 MED ORDER — ROCURONIUM BROMIDE 100 MG/10ML IV SOLN
INTRAVENOUS | Status: DC | PRN
  Administered 2012-11-07: 10 mg via INTRAVENOUS
  Administered 2012-11-07: 40 mg via INTRAVENOUS

## 2012-11-07 MED ORDER — EPHEDRINE SULFATE 50 MG/ML IJ SOLN
INTRAMUSCULAR | Status: DC | PRN
  Administered 2012-11-07 (×2): 5 mg via INTRAVENOUS

## 2012-11-07 MED ORDER — ACETAMINOPHEN 10 MG/ML IV SOLN
INTRAVENOUS | Status: AC
  Filled 2012-11-07: qty 100

## 2012-11-07 MED ORDER — MIDAZOLAM HCL 2 MG/2ML IJ SOLN
INTRAMUSCULAR | Status: AC
  Filled 2012-11-07: qty 2

## 2012-11-07 MED ORDER — ONDANSETRON HCL 4 MG/2ML IJ SOLN: 4.0000 mg | Freq: Once | INTRAMUSCULAR | Status: DC | PRN

## 2012-11-07 MED ORDER — SODIUM CHLORIDE 0.9 % IJ SOLN: 9.0000 mL | INTRAMUSCULAR | Status: DC | PRN

## 2012-11-07 MED ORDER — KETOROLAC TROMETHAMINE 30 MG/ML IJ SOLN
30.0000 mg | Freq: Once | INTRAMUSCULAR | Status: AC
  Administered 2012-11-07: 30 mg via INTRAVENOUS

## 2012-11-07 MED ORDER — CEFAZOLIN SODIUM-DEXTROSE 2-3 GM-% IV SOLR
INTRAVENOUS | Status: AC
  Filled 2012-11-07: qty 50

## 2012-11-07 MED ORDER — GLYCOPYRROLATE 0.2 MG/ML IJ SOLN
INTRAMUSCULAR | Status: AC
  Filled 2012-11-07: qty 2

## 2012-11-07 MED ORDER — 0.9 % SODIUM CHLORIDE (POUR BTL) OPTIME
TOPICAL | Status: DC | PRN
  Administered 2012-11-07: 1000 mL

## 2012-11-07 MED ORDER — NALOXONE HCL 0.4 MG/ML IJ SOLN: 0.4000 mg | INTRAMUSCULAR | Status: DC | PRN

## 2012-11-07 MED ORDER — PROMETHAZINE HCL 25 MG/ML IJ SOLN: 12.5000 mg | INTRAMUSCULAR | Status: DC | PRN

## 2012-11-07 MED ORDER — PROPOFOL 10 MG/ML IV EMUL
INTRAVENOUS | Status: AC
  Filled 2012-11-07: qty 20

## 2012-11-07 MED ORDER — NEOSTIGMINE METHYLSULFATE 1 MG/ML IJ SOLN
INTRAMUSCULAR | Status: AC
  Filled 2012-11-07: qty 1

## 2012-11-07 MED ORDER — SUFENTANIL CITRATE 50 MCG/ML IV SOLN
INTRAVENOUS | Status: AC
  Filled 2012-11-07: qty 1

## 2012-11-07 MED ORDER — ONDANSETRON HCL 4 MG/2ML IJ SOLN
INTRAMUSCULAR | Status: AC
  Filled 2012-11-07: qty 2

## 2012-11-07 MED ORDER — EPHEDRINE SULFATE 50 MG/ML IJ SOLN
INTRAMUSCULAR | Status: AC
  Filled 2012-11-07: qty 1

## 2012-11-07 MED ORDER — ACETAMINOPHEN 10 MG/ML IV SOLN
1000.0000 mg | Freq: Four times a day (QID) | INTRAVENOUS | Status: AC
  Administered 2012-11-07 – 2012-11-08 (×4): 1000 mg via INTRAVENOUS
  Filled 2012-11-07 (×4): qty 100

## 2012-11-07 MED ORDER — FENTANYL CITRATE 0.05 MG/ML IJ SOLN: 25.0000 ug | INTRAMUSCULAR | Status: DC | PRN

## 2012-11-07 SURGICAL SUPPLY — 44 items
BAG HAMPER (MISCELLANEOUS) ×2 IMPLANT
BNDG COHESIVE 4X5 TAN NS LF (GAUZE/BANDAGES/DRESSINGS) ×2 IMPLANT
CLOTH BEACON ORANGE TIMEOUT ST (SAFETY) ×2 IMPLANT
COOLER CRYO CUFF IC AND MOTOR (MISCELLANEOUS) ×2 IMPLANT
COVER LIGHT HANDLE STERIS (MISCELLANEOUS) ×4 IMPLANT
COVER MAYO STAND XLG (DRAPE) ×2 IMPLANT
CUFF CRYO UNI SHDR 32X48 (MISCELLANEOUS) ×2 IMPLANT
DURAPREP 26ML APPLICATOR (WOUND CARE) ×2 IMPLANT
FORMALIN 10 PREFIL 120ML (MISCELLANEOUS) ×4 IMPLANT
GAUZE XEROFORM 5X9 LF (GAUZE/BANDAGES/DRESSINGS) ×2 IMPLANT
GLOVE BIO SURGEON STRL SZ8 (GLOVE) ×2 IMPLANT
GLOVE BIO SURGEON STRL SZ8.5 (GLOVE) ×4 IMPLANT
GLOVE ECLIPSE 6.5 STRL STRAW (GLOVE) ×2 IMPLANT
GLOVE ECLIPSE 7.0 STRL STRAW (GLOVE) ×2 IMPLANT
GLOVE INDICATOR 7.0 STRL GRN (GLOVE) ×6 IMPLANT
GLOVE SS BIOGEL STRL SZ 6.5 (GLOVE) ×2 IMPLANT
GLOVE SUPERSENSE BIOGEL SZ 6.5 (GLOVE) ×2
GOWN STRL REIN XL XLG (GOWN DISPOSABLE) ×8 IMPLANT
INST SET MINOR BONE (KITS) ×2 IMPLANT
KIT ROOM TURNOVER APOR (KITS) ×2 IMPLANT
KIT SURGICAL DEVON (SET/KITS/TRAYS/PACK) ×2 IMPLANT
MANIFOLD NEPTUNE II (INSTRUMENTS) ×2 IMPLANT
MARKER SKIN DUAL TIP RULER LAB (MISCELLANEOUS) ×2 IMPLANT
NEEDLE MAYO 6 CRC TAPER PT (NEEDLE) ×2 IMPLANT
NS IRRIG 1000ML POUR BTL (IV SOLUTION) ×2 IMPLANT
PACK MINOR (CUSTOM PROCEDURE TRAY) ×2 IMPLANT
PAD ABD 5X9 TENDERSORB (GAUZE/BANDAGES/DRESSINGS) ×2 IMPLANT
PAD ARMBOARD 7.5X6 YLW CONV (MISCELLANEOUS) ×2 IMPLANT
RASP LG TEAR CROSS CUT (RASP) ×2 IMPLANT
SET BASIN LINEN APH (SET/KITS/TRAYS/PACK) ×2 IMPLANT
SOL PREP PROV IODINE SCRUB 4OZ (MISCELLANEOUS) IMPLANT
SPONGE GAUZE 4X4 12PLY (GAUZE/BANDAGES/DRESSINGS) ×2 IMPLANT
SPONGE LAP 18X18 X RAY DECT (DISPOSABLE) ×2 IMPLANT
STOCKINETTE IMPERVIOUS LG (DRAPES) ×2 IMPLANT
STRIP CLOSURE SKIN 1/4X3 (GAUZE/BANDAGES/DRESSINGS) ×8 IMPLANT
SUT BONE WAX W31G (SUTURE) IMPLANT
SUT BRALON NAB BRD #1 30IN (SUTURE) ×8 IMPLANT
SUT CHROMIC 0 CTX 36 (SUTURE) ×4 IMPLANT
SUT ETHIBOND NAB OS 4 #2 30IN (SUTURE) IMPLANT
SUT ETHILON 3 0 FSL (SUTURE) ×2 IMPLANT
SUT PLAIN 2 0 XLH (SUTURE) ×2 IMPLANT
SYR BULB IRRIGATION 50ML (SYRINGE) ×4 IMPLANT
TAPE MEDIFIX FOAM 3 (GAUZE/BANDAGES/DRESSINGS) ×2 IMPLANT
YANKAUER SUCT 12FT TUBE ARGYLE (SUCTIONS) ×2 IMPLANT

## 2012-11-07 NOTE — Progress Notes (Signed)
The History and Physical is unchanged. I have examined the patient. The patient is medically able to have surgery on the right shoulder . John Molina 

## 2012-11-07 NOTE — Anesthesia Postprocedure Evaluation (Signed)
Anesthesia Post Note  Patient: John Molina  Procedure(s) Performed: Procedure(s) (LRB): ROTATOR CUFF REPAIR SHOULDER OPEN (Right) SHOULDER ACROMIOPLASTY (Right)  Anesthesia type: General  Patient location: PACU  Post pain: Pain level 4/10  Post assessment: Post-op Vital signs reviewed, Patient's Cardiovascular Status Stable, Respiratory Function Stable, Patent Airway, No signs of Nausea or vomiting and Pain level controlled  Last Vitals:  Filed Vitals:   11/07/12 1114  BP: 167/75  Pulse:   Temp: 36.6 C  Resp: 20    Post vital signs: Reviewed and stable  Level of consciousness: awake and alert   Complications: No apparent anesthesia complications

## 2012-11-07 NOTE — Anesthesia Preprocedure Evaluation (Addendum)
Anesthesia Evaluation  Patient identified by MRN, date of birth, ID band Patient awake    Reviewed: Allergy & Precautions, H&P , NPO status , Patient's Chart, lab work & pertinent test results  Airway Mallampati: II TM Distance: >3 FB Neck ROM: Full    Dental  (+) Edentulous Upper   Pulmonary neg pulmonary ROS,  breath sounds clear to auscultation        Cardiovascular hypertension, Pt. on medications Rhythm:Regular Rate:Normal     Neuro/Psych    GI/Hepatic (+) Hepatitis -, C  Endo/Other    Renal/GU      Musculoskeletal  (+) Arthritis - (chronic LBP),   Abdominal   Peds  Hematology   Anesthesia Other Findings   Reproductive/Obstetrics                           Anesthesia Physical Anesthesia Plan  ASA: II  Anesthesia Plan: General   Post-op Pain Management:    Induction: Intravenous  Airway Management Planned: Oral ETT  Additional Equipment:   Intra-op Plan:   Post-operative Plan: Extubation in OR  Informed Consent: I have reviewed the patients History and Physical, chart, labs and discussed the procedure including the risks, benefits and alternatives for the proposed anesthesia with the patient or authorized representative who has indicated his/her understanding and acceptance.     Plan Discussed with:   Anesthesia Plan Comments:         Anesthesia Quick Evaluation

## 2012-11-07 NOTE — Transfer of Care (Signed)
Immediate Anesthesia Transfer of Care Note  Patient: John Molina  Procedure(s) Performed: Procedure(s) (LRB): ROTATOR CUFF REPAIR SHOULDER OPEN (Right) SHOULDER ACROMIOPLASTY (Right)  Patient Location: PACU  Anesthesia Type: General  Level of Consciousness: awake  Airway & Oxygen Therapy: Patient Spontanous Breathing and non-rebreather face mask  Post-op Assessment: Report given to PACU RN, Post -op Vital signs reviewed and stable and Patient moving all extremities  Post vital signs: Reviewed and stable  Complications: No apparent anesthesia complications

## 2012-11-07 NOTE — Anesthesia Procedure Notes (Signed)
Procedure Name: Intubation Date/Time: 11/07/2012 7:30 AM Performed by: Franco Nones Pre-anesthesia Checklist: Patient identified, Patient being monitored, Timeout performed, Emergency Drugs available and Suction available Patient Re-evaluated:Patient Re-evaluated prior to inductionOxygen Delivery Method: Circle System Utilized Preoxygenation: Pre-oxygenation with 100% oxygen Intubation Type: IV induction Ventilation: Mask ventilation without difficulty Laryngoscope Size: Miller and 2 Grade View: Grade I Tube type: Oral Tube size: 7.0 mm Number of attempts: 1 Airway Equipment and Method: stylet Placement Confirmation: ETT inserted through vocal cords under direct vision,  positive ETCO2 and breath sounds checked- equal and bilateral Secured at: 21 cm Tube secured with: Tape Dental Injury: Teeth and Oropharynx as per pre-operative assessment

## 2012-11-07 NOTE — Brief Op Note (Signed)
11/07/2012  9:05 AM  PATIENT:  John Molina  72 y.o. male  PRE-OPERATIVE DIAGNOSIS:  tear right rotator cuff  POST-OPERATIVE DIAGNOSIS:  tear right rotator cuff  PROCEDURE:  Procedure(s) with comments: ROTATOR CUFF REPAIR SHOULDER OPEN (Right) - Right Open Rotator Cuff Repair with modified acrominoplasty  SURGEON:  Surgeon(s) and Role:    * Darreld Mclean, MD - Primary  PHYSICIAN ASSISTANT:   ASSISTANTS: [place], RN   ANESTHESIA:   general  EBL:  Total I/O In: 1000 [I.V.:1000] Out: 15 [Blood:15]  BLOOD ADMINISTERED:none  DRAINS: none   LOCAL MEDICATIONS USED:  NONE  SPECIMEN:  Source of Specimen:  Right bone fragments, corococlavicular ligament  DISPOSITION OF SPECIMEN:  PATHOLOGY  COUNTS:  YES  TOURNIQUET:  * No tourniquets in log *  DICTATION: .Other Dictation: Dictation Number [id number]  PLAN OF CARE: Admit for overnight observation  PATIENT DISPOSITION:  PACU - hemodynamically stable.   Delay start of Pharmacological VTE agent (>24hrs) due to surgical blood loss or risk of bleeding: no

## 2012-11-07 NOTE — Anesthesia Postprocedure Evaluation (Signed)
  Anesthesia Post-op Note  Patient: John Molina  Procedure(s) Performed: Procedure(s) with comments: ROTATOR CUFF REPAIR SHOULDER OPEN (Right) - Right Open Rotator Cuff Repair SHOULDER ACROMIOPLASTY (Right)  Patient Location: PACU  Anesthesia Type:General  Level of Consciousness: awake, oriented and patient cooperative  Airway and Oxygen Therapy: Patient Spontanous Breathing and non-rebreather face mask  Post-op Pain: severe  Post-op Assessment: Post-op Vital signs reviewed, Patient's Cardiovascular Status Stable, Respiratory Function Stable, Patent Airway and No signs of Nausea or vomiting  Post-op Vital Signs: Reviewed and stable  Complications: No apparent anesthesia complications

## 2012-11-08 MED ORDER — ALBUTEROL SULFATE (5 MG/ML) 0.5% IN NEBU: 2.5000 mg | INHALATION_SOLUTION | RESPIRATORY_TRACT | Status: DC | PRN

## 2012-11-08 MED ORDER — OXYCODONE HCL 5 MG PO TABS
15.0000 mg | ORAL_TABLET | ORAL | Status: DC | PRN
  Administered 2012-11-08: 15 mg via ORAL
  Filled 2012-11-08: qty 3

## 2012-11-08 MED ORDER — HYDROMORPHONE HCL PF 1 MG/ML IJ SOLN
0.5000 mg | INTRAMUSCULAR | Status: DC | PRN
  Administered 2012-11-08: 0.5 mg via INTRAVENOUS
  Filled 2012-11-08: qty 1

## 2012-11-08 MED ORDER — OXYCODONE HCL 5 MG PO TABS
20.0000 mg | ORAL_TABLET | ORAL | Status: DC | PRN
  Administered 2012-11-08 (×3): 20 mg via ORAL
  Administered 2012-11-09: 10 mg via ORAL
  Administered 2012-11-09: 20 mg via ORAL
  Filled 2012-11-08: qty 4
  Filled 2012-11-08: qty 2
  Filled 2012-11-08 (×3): qty 4

## 2012-11-08 NOTE — Progress Notes (Signed)
He is still in pain, but not as much.  He got a little nauseous this morning but that has passed.  He has been up out of bed.  I will increase his oxycodone.  The shoulder immobilizer will be applied.  Hopefully, he can go home tomorrow am.

## 2012-11-08 NOTE — Op Note (Signed)
NAME:  John Molina, John Molina NO.:  [id number]  MEDICAL RECORD NO.:  [id number]  LOCATION:  A323                          FACILITY:  [facility]  PHYSICIAN:  J. Darreld Mclean, M.D. DATE OF BIRTH:  1941-06-28  DATE OF PROCEDURE: DATE OF DISCHARGE:                              OPERATIVE REPORT   PREOPERATIVE DIAGNOSES:  Rotator cuff tear, right shoulder, impingement syndrome.  POSTOPERATIVE DIAGNOSES:  Rotator cuff tear, right shoulder, impingement syndrome.  PROCEDURE:  Open rotator cuff repair, modified acromioplasty.  ANESTHESIA:  General.  TOURNIQUET:  No tourniquet.  DRAINS:  No drains.  SURGEON:  J. Darreld Mclean, M.D.  ASSISTANTHeron Sabins, RN.  INDICATIONS:  The patient hurt his shoulder in last September 2013.  He had pain and tenderness since then.  MRI in October showed a partial supraspinatus tear, partial thickness more on the bursal side and impingement from significant degenerative joint disease of the acromioclavicular joint.  The patient tried conservative treatment.  He tried physical therapy, exercises, anti-inflammatories, rest, injection, and still has had pain and tenderness.  He says, however, his shoulder hurt and elected surgery.  I explained him the risks and imponderables of the procedure.  He appeared to understand the procedure as outlined. He understands he may have some residual deficit or pain with the shoulder no matter what was done.  The patient appeared to understand and agreed to it.  DESCRIPTION OF PROCEDURE:  The patient in the holding area, identified the right shoulder as correct surgical site.  I placed a mark on the right shoulder.  The patient was brought to the operating room and given general anesthesia.  He was placed in a semi-barber chair position.  Prepped and draped in the usual manner.  Generalized time-out identifying the patient as Mr. Ladona Ridgel, and we were doing the right shoulder for rotator cuff repair.  All  instrumentation was properly positioned and working.  Everyone in the OR knew each other.  Incision was made with careful dissection, the deltoid was exposed.  The so-called weak spot in the deltoid was opened and the coracoacromial ligament was identified.  The coracoacromial ligament was then excised.  This allowed easier inspection of the shoulder joint itself.  Had a significant spur coming off the [facility] joint and one other portion off the acromion.  Using a broad-based osteotome and then a power rasp, this area was smooth and got a good smooth contour.  There is no further evidence of impingement.  The shoulder was examined and there was no apparent tear.  Rotator cuff was then opened.  The biceps tendon was present, but has some fraying in the central portion.  The head of the humerus looked good. A portion of the supraspinatus fragments were removed and then the repair was done using #1 Surgilon suture. It came together very nicely.  Shoulder was then put through range of motion.  No apparent problem.  No further evidence of marked impingement.  It should be noted that after the acromioplasty, the shoulder was irrigated prior to entering the rotator cuff itself.  I cleaned of all debris.  The wound was then repaired using a 2-0 chromic for the deltoid in  a modified figure-of-eight fashion.  Subcutaneous tissue closed using 2-0 plain and then a running subcuticular 3-0 nylon suture.  Sterile dressing applied after first applying Steri-Strips. He will be placed in observation overnight for pain control.  He went to PACU in good condition.          ______________________________ Shela Commons. Darreld Mclean, M.D.     JWK/MEDQ  D:  11/07/2012  T:  11/07/2012  Job:  [id number]

## 2012-11-08 NOTE — Progress Notes (Signed)
Subjective: 1 Day Post-Op Procedure(s) (LRB): ROTATOR CUFF REPAIR SHOULDER OPEN (Right) SHOULDER ACROMIOPLASTY (Right) Patient reports pain as 8 on 0-10 scale.    Objective: Vital signs in last 24 hours: Temp:  [97.5 F (36.4 C)-98.4 F (36.9 C)] 98.3 F (36.8 C) (02/21 0650) Pulse Rate:  [65-88] 85 (02/21 0650) Resp:  [10-28] 18 (02/21 0650) BP: (113-173)/(61-85) 149/78 mmHg (02/21 0650) SpO2:  [95 %-100 %] 96 % (02/21 0654) Weight:  [79.379 kg (175 lb)] 79.379 kg (175 lb) (02/20 1228)  Intake/Output from previous day: 02/20 0701 - 02/21 0700 In: 1440 [P.O.:240; I.V.:1200] Out: 40 [Urine:25; Blood:15] Intake/Output this shift:    No results found for this basename: HGB,  in the last 72 hours No results found for this basename: WBC, RBC, HCT, PLT,  in the last 72 hours No results found for this basename: NA, K, CL, CO2, BUN, CREATININE, GLUCOSE, CALCIUM,  in the last 72 hours No results found for this basename: LABPT, INR,  in the last 72 hours  Neurologically intact Neurovascular intact Sensation intact distally Intact pulses distally  He continues to have pain.  I will adjust pain medicine and change to oral.  I will stop the cryocuff.  I will have him get out of bed.  Assessment/Plan: 1 Day Post-Op Procedure(s) (LRB): ROTATOR CUFF REPAIR SHOULDER OPEN (Right) SHOULDER ACROMIOPLASTY (Right) Plan for discharge tomorrow  John Molina 11/08/2012, 7:27 AM

## 2012-11-08 NOTE — Anesthesia Postprocedure Evaluation (Signed)
Anesthesia Post Note  Patient: John Molina  Procedure(s) Performed: Procedure(s) (LRB): ROTATOR CUFF REPAIR SHOULDER OPEN (Right) SHOULDER ACROMIOPLASTY (Right)  Anesthesia type: General  Patient location: 323  Post pain: Pain level controlled  Post assessment: Post-op Vital signs reviewed, Patient's Cardiovascular Status Stable, Respiratory Function Stable, Patent Airway, No signs of Nausea or vomiting and Pain level controlled  Last Vitals:  Filed Vitals:   11/08/12 0650  BP: 149/78  Pulse: 85  Temp: 36.8 C  Resp: 18    Post vital signs: Reviewed and stable  Level of consciousness: awake and alert   Complications: No apparent anesthesia complications

## 2012-11-08 NOTE — Progress Notes (Signed)
UR Chart Review Completed  

## 2012-11-08 NOTE — Care Management Note (Unsigned)
    Page 1 of 1   11/08/2012     11:14:38 AM   CARE MANAGEMENT NOTE 11/08/2012  Patient:  John Molina, John Molina   Account Number:  [id number]  Date Initiated:  11/08/2012  Documentation initiated by:  Sharrie Rothman  Subjective/Objective Assessment:   Pt admitted from home s/p right rotator cuff repair. Pt lives with his wife and will return home at discharge. Pt is independent with ADL's.     Action/Plan:   No CM or HH needs noted. CM did give pt and his wife a list of PCP's in [place] that take Medicare.   Anticipated DC Date:  11/09/2012   Anticipated DC Plan:  HOME/SELF CARE      DC Planning Services  CM consult      Choice offered to / List presented to:             Status of service:  In process, will continue to follow Medicare Important Message given?   (If response is "NO", the following Medicare IM given date fields will be blank) Date Medicare IM given:   Date Additional Medicare IM given:    Discharge Disposition:    Per UR Regulation:    If discussed at Long Length of Stay Meetings, dates discussed:    Comments:  11/08/12 1115 Arlyss Queen, RN BSN CM

## 2012-11-09 MED ORDER — OXYCODONE-ACETAMINOPHEN 10-325 MG PO TABS
ORAL_TABLET | ORAL | Status: DC
Start: 2012-11-09 — End: 2013-01-09

## 2012-11-09 NOTE — Progress Notes (Signed)
Subjective: 2 Days Post-Op Procedure(s) (LRB): ROTATOR CUFF REPAIR SHOULDER OPEN (Right) SHOULDER ACROMIOPLASTY (Right) Patient reports pain as 3 on 0-10 scale.    Objective: Vital signs in last 24 hours: Temp:  [98.2 F (36.8 C)-99.2 F (37.3 C)] 98.2 F (36.8 C) (02/22 0500) Pulse Rate:  [65-73] 67 (02/22 0500) Resp:  [16-20] 16 (02/22 0500) BP: (148-170)/(58-73) 149/58 mmHg (02/22 0500) SpO2:  [92 %-96 %] 92 % (02/22 0500)  Intake/Output from previous day: 02/21 0701 - 02/22 0700 In: 480 [P.O.:480] Out: 600 [Urine:600] Intake/Output this shift:    No results found for this basename: HGB,  in the last 72 hours No results found for this basename: WBC, RBC, HCT, PLT,  in the last 72 hours No results found for this basename: NA, K, CL, CO2, BUN, CREATININE, GLUCOSE, CALCIUM,  in the last 72 hours No results found for this basename: LABPT, INR,  in the last 72 hours  Neurologically intact Neurovascular intact Sensation intact distally Intact pulses distally Incision: scant drainage No cellulitis present Compartment soft  His pain is now controlled.  He understands wound care.  I will see him in my office in ten days.  Assessment/Plan: 2 Days Post-Op Procedure(s) (LRB): ROTATOR CUFF REPAIR SHOULDER OPEN (Right) SHOULDER ACROMIOPLASTY (Right) Discharge home.  Carolan Avedisian 11/09/2012, 9:49 AM

## 2012-11-09 NOTE — Discharge Summary (Signed)
Physician Discharge Summary  Patient ID: John Molina MRN: [id number] DOB/AGE: 12/31/1940 72 y.o.  Admit date: 11/07/2012 Discharge date: 11/09/2012  Admission Diagnoses: Tear rotator cuff right shoulder  Discharge Diagnoses: Rotator cuff tear right Active Problems:   * No active hospital problems. *   Discharged Condition: good  Hospital Course: He had open rotator cuff repair surgery on the day of admission.  He tolerated it well.  He was admitted for observation and pain control.  He has long history of being on chronic narcotics for severe lower back pain.  He had pain the first 24 hours requiring IV Dilaudid.  I changed him to oral oxycodone the second 24 hours.  He had good control of the pain.  The wound looks good.  Neurovascular is intact.  He understands wound care and use of the sling and swathe.    Consults: None  Significant Diagnostic Studies: labs: normal  Treatments: surgery: open rotator cuff repair right  Discharge Exam: Blood pressure 149/58, pulse 67, temperature 98.2 F (36.8 C), temperature source Oral, resp. rate 16, height 5\' 8"  (1.727 m), weight 79.379 kg (175 lb), SpO2 92.00%. General appearance: alert, cooperative, appears stated age and no distress.  Wound looks good.  Neurovascular intact.  Disposition: 01-Home or Self Care  Discharge Orders   Future Orders Complete By Expires     Call MD / Call 911  As directed     Comments:      If you experience chest pain or shortness of breath, CALL 911 and be transported to the hospital emergency room.  If you develope a fever above 101 F, pus (white drainage) or increased drainage or redness at the wound, or calf pain, call your surgeon's office.    Constipation Prevention  As directed     Comments:      Drink plenty of fluids.  Prune juice may be helpful.  You may use a stool softener, such as Colace (over the counter) 100 mg twice a day.  Use MiraLax (over the counter) for constipation as needed.    Diet  - low sodium heart healthy  As directed     Discharge instructions  As directed     Comments:      Keep wound of the right shoulder dry. You may change dressing as desired.  You may apply very large Band-aids (available from drug stores) to area as desired. Continue to use the sling and swathe.  Move fingers often. Sleep semi-erect, as in a lazy boy chair. Keep appointment to see Dr. Hilda Molina in 10 days or so.  Office [phone].  After hours, may contact through hospital at [phone].    Increase activity slowly as tolerated  As directed         Medication List    STOP taking these medications       oxyCODONE-acetaminophen 7.5-325 MG per tablet  Commonly known as:  PERCOCET      TAKE these medications       fluocinolone 0.025 % cream  Commonly known as:  SYNALAR  Apply 1 application topically 3 (three) times daily as needed (psoriasis).     oxyCODONE-acetaminophen 10-325 MG per tablet  Commonly known as:  PERCOCET  One to two tablets as need for pain every four hours.     polyethylene glycol powder powder  Commonly known as:  GLYCOLAX/MIRALAX  Take 17 g by mouth daily.           Follow-up Information   Follow  up with John Mclean, MD In 10 days.   Contact information:   [address] [place] [address] [phone]       Signed: Darreld Molina 11/09/2012, 9:56 AM

## 2012-11-11 ENCOUNTER — Encounter (HOSPITAL_COMMUNITY): Payer: Self-pay | Admitting: Orthopaedic Surgery

## 2012-12-31 DIAGNOSIS — E785 Hyperlipidemia, unspecified: Secondary | ICD-10-CM | POA: Diagnosis not present

## 2012-12-31 DIAGNOSIS — I10 Essential (primary) hypertension: Secondary | ICD-10-CM | POA: Diagnosis not present

## 2012-12-31 DIAGNOSIS — M549 Dorsalgia, unspecified: Secondary | ICD-10-CM | POA: Diagnosis not present

## 2013-01-06 DIAGNOSIS — I10 Essential (primary) hypertension: Secondary | ICD-10-CM | POA: Diagnosis not present

## 2013-01-06 DIAGNOSIS — E785 Hyperlipidemia, unspecified: Secondary | ICD-10-CM | POA: Diagnosis not present

## 2013-01-06 DIAGNOSIS — R7301 Impaired fasting glucose: Secondary | ICD-10-CM | POA: Diagnosis not present

## 2013-01-08 ENCOUNTER — Other Ambulatory Visit: Payer: Self-pay | Admitting: Neurosurgery

## 2013-01-08 DIAGNOSIS — M48061 Spinal stenosis, lumbar region without neurogenic claudication: Secondary | ICD-10-CM | POA: Diagnosis not present

## 2013-01-09 ENCOUNTER — Encounter (HOSPITAL_COMMUNITY)
Admission: RE | Admit: 2013-01-09 | Discharge: 2013-01-09 | Disposition: A | Discharge status: Home / Self Care | Payer: Medicare Other | Source: Ambulatory Visit | Attending: Neurosurgery | Admitting: Neurosurgery

## 2013-01-09 ENCOUNTER — Encounter (HOSPITAL_COMMUNITY)
Admission: RE | Admit: 2013-01-09 | Discharge: 2013-01-09 | Disposition: A | Discharge status: Home / Self Care | Payer: Medicare Other | Source: Ambulatory Visit | Attending: Anesthesiology | Admitting: Anesthesiology

## 2013-01-09 ENCOUNTER — Encounter (HOSPITAL_COMMUNITY): Payer: Self-pay

## 2013-01-09 DIAGNOSIS — Z01812 Encounter for preprocedural laboratory examination: Secondary | ICD-10-CM | POA: Diagnosis not present

## 2013-01-09 DIAGNOSIS — M48062 Spinal stenosis, lumbar region with neurogenic claudication: Secondary | ICD-10-CM | POA: Diagnosis not present

## 2013-01-09 DIAGNOSIS — Z01818 Encounter for other preprocedural examination: Secondary | ICD-10-CM | POA: Diagnosis not present

## 2013-01-09 DIAGNOSIS — I10 Essential (primary) hypertension: Secondary | ICD-10-CM | POA: Diagnosis not present

## 2013-01-09 DIAGNOSIS — Z79899 Other long term (current) drug therapy: Secondary | ICD-10-CM | POA: Diagnosis not present

## 2013-01-09 HISTORY — DX: Major depressive disorder, single episode, unspecified: F32.9

## 2013-01-09 HISTORY — DX: Depression, unspecified: F32.A

## 2013-01-09 LAB — COMPREHENSIVE METABOLIC PANEL
Albumin: 4.2 g/dL (ref 3.5–5.2)
BUN: 10 mg/dL (ref 6–23)
CO2: 28 mEq/L (ref 19–32)
Chloride: 104 mEq/L (ref 96–112)
Creatinine, Ser: 0.85 mg/dL (ref 0.50–1.35)
GFR calc Af Amer: >90 mL/min (ref >90)
GFR calc non Af Amer: 87 mL/min — ABNORMAL LOW (ref >90)
Glucose, Bld: 86 mg/dL (ref 70–99)
Total Bilirubin: 0.4 mg/dL (ref 0.3–1.2)

## 2013-01-09 LAB — CBC WITH DIFFERENTIAL/PLATELET
Basophils Relative: 1 % (ref 0–1)
HCT: 34.7 % — ABNORMAL LOW (ref 39.0–52.0)
Hemoglobin: 12.3 g/dL — ABNORMAL LOW (ref 13.0–17.0)
MCHC: 35.4 g/dL (ref 30.0–36.0)
Monocytes Absolute: 0.5 10*3/uL (ref 0.1–1.0)
Monocytes Relative: 11 % (ref 3–12)
Neutro Abs: 2 10*3/uL (ref 1.7–7.7)

## 2013-01-09 LAB — SURGICAL PCR SCREEN: Staphylococcus aureus: NEGATIVE

## 2013-01-09 MED ORDER — DEXTROSE 5 % IV SOLN: 2.0000 g | INTRAVENOUS | Status: DC

## 2013-01-09 NOTE — Progress Notes (Signed)
Primary Physician - Dr. Dwana Melena Does not have a Cardiologist EKG in chart no other cardiac testing

## 2013-01-09 NOTE — Pre-Procedure Instructions (Signed)
John Molina  01/09/2013   Your procedure is scheduled on:  Friday, April 25th  Report to [facility] at 1000 AM.  Call this number if you have problems the morning of surgery: [phone]   Remember:   Do not eat food or drink liquids after midnight.    Take these medicines the morning of surgery with A SIP OF WATER: oxycodone   Do not wear jewelry.  Do not wear lotions, powders, or perfumes,deodorant.   Do not bring valuables to the hospital.  Contacts, dentures or bridgework may not be worn into surgery.  Leave suitcase in the car. After surgery it may be brought to your room.  For patients admitted to the hospital, checkout time is 11:00 AM the day of  discharge.   Patients discharged the day of surgery will not be allowed to drive home.    Special Instructions: Shower using CHG 2 nights before surgery and the night before surgery.  If you shower the day of surgery use CHG.  Use special wash - you have one bottle of CHG for all showers.  You should use approximately 1/3 of the bottle for each shower.   Please read over the following fact sheets that you were given: Pain Booklet, Coughing and Deep Breathing, MRSA Information and Surgical Site Infection Prevention

## 2013-01-10 ENCOUNTER — Inpatient Hospital Stay (HOSPITAL_COMMUNITY): Payer: Medicare Other | Admitting: Certified Registered"

## 2013-01-10 ENCOUNTER — Encounter (HOSPITAL_COMMUNITY): Payer: Self-pay | Admitting: Certified Registered"

## 2013-01-10 ENCOUNTER — Inpatient Hospital Stay (HOSPITAL_COMMUNITY): Payer: Medicare Other

## 2013-01-10 ENCOUNTER — Observation Stay (HOSPITAL_COMMUNITY)
Admission: RE | Admit: 2013-01-10 | Discharge: 2013-01-11 | Disposition: A | Discharge status: Home / Self Care | Payer: Medicare Other | Source: Ambulatory Visit | Attending: Neurosurgery | Admitting: Neurosurgery

## 2013-01-10 ENCOUNTER — Inpatient Hospital Stay (HOSPITAL_COMMUNITY): Admission: RE | Admit: 2013-01-10 | Payer: Medicare Other | Source: Ambulatory Visit | Admitting: Neurosurgery

## 2013-01-10 ENCOUNTER — Encounter (HOSPITAL_COMMUNITY)
Admission: RE | Disposition: A | Discharge status: Home / Self Care | Payer: Self-pay | Source: Ambulatory Visit | Attending: Neurosurgery

## 2013-01-10 ENCOUNTER — Encounter (HOSPITAL_COMMUNITY): Payer: Self-pay | Admitting: *Deleted

## 2013-01-10 ENCOUNTER — Encounter (HOSPITAL_COMMUNITY): Admission: RE | Payer: Self-pay | Source: Ambulatory Visit

## 2013-01-10 DIAGNOSIS — M519 Unspecified thoracic, thoracolumbar and lumbosacral intervertebral disc disorder: Secondary | ICD-10-CM | POA: Diagnosis not present

## 2013-01-10 DIAGNOSIS — Z79899 Other long term (current) drug therapy: Secondary | ICD-10-CM | POA: Insufficient documentation

## 2013-01-10 DIAGNOSIS — Z01812 Encounter for preprocedural laboratory examination: Secondary | ICD-10-CM | POA: Diagnosis not present

## 2013-01-10 DIAGNOSIS — I10 Essential (primary) hypertension: Secondary | ICD-10-CM | POA: Diagnosis not present

## 2013-01-10 DIAGNOSIS — C189 Malignant neoplasm of colon, unspecified: Secondary | ICD-10-CM | POA: Diagnosis not present

## 2013-01-10 DIAGNOSIS — M48062 Spinal stenosis, lumbar region with neurogenic claudication: Secondary | ICD-10-CM | POA: Diagnosis not present

## 2013-01-10 DIAGNOSIS — M549 Dorsalgia, unspecified: Secondary | ICD-10-CM | POA: Diagnosis not present

## 2013-01-10 DIAGNOSIS — M48061 Spinal stenosis, lumbar region without neurogenic claudication: Secondary | ICD-10-CM | POA: Diagnosis not present

## 2013-01-10 DIAGNOSIS — Z01818 Encounter for other preprocedural examination: Secondary | ICD-10-CM | POA: Diagnosis not present

## 2013-01-10 HISTORY — DX: Sleep apnea, unspecified: G47.30

## 2013-01-10 HISTORY — PX: LUMBAR LAMINECTOMY/DECOMPRESSION MICRODISCECTOMY: SHX5026

## 2013-01-10 SURGERY — LUMBAR LAMINECTOMY/DECOMPRESSION MICRODISCECTOMY 2 LEVELS
Anesthesia: General | Site: Back | Laterality: Bilateral

## 2013-01-10 SURGERY — LUMBAR LAMINECTOMY/DECOMPRESSION MICRODISCECTOMY 2 LEVELS
Anesthesia: General | Site: Spine Lumbar | Laterality: Bilateral | Wound class: Clean

## 2013-01-10 MED ORDER — THROMBIN 5000 UNITS EX SOLR
CUTANEOUS | Status: DC | PRN
  Administered 2013-01-10 (×2): 5000 [IU] via TOPICAL

## 2013-01-10 MED ORDER — HYDROMORPHONE HCL PF 1 MG/ML IJ SOLN: 0.5000 mg | INTRAMUSCULAR | Status: DC | PRN

## 2013-01-10 MED ORDER — OXYCODONE HCL 5 MG PO TABS
15.0000 mg | ORAL_TABLET | Freq: Four times a day (QID) | ORAL | Status: DC | PRN
  Administered 2013-01-10 – 2013-01-11 (×3): 15 mg via ORAL
  Filled 2013-01-10 (×4): qty 3

## 2013-01-10 MED ORDER — HYDROMORPHONE HCL PF 1 MG/ML IJ SOLN
INTRAMUSCULAR | Status: AC
  Filled 2013-01-10: qty 1

## 2013-01-10 MED ORDER — SODIUM CHLORIDE 0.9 % IR SOLN
Status: DC | PRN
  Administered 2013-01-10: 12:00:00

## 2013-01-10 MED ORDER — ONDANSETRON HCL 4 MG/2ML IJ SOLN: 4.0000 mg | INTRAMUSCULAR | Status: DC | PRN

## 2013-01-10 MED ORDER — EPHEDRINE SULFATE 50 MG/ML IJ SOLN
INTRAMUSCULAR | Status: DC | PRN
  Administered 2013-01-10: 15 mg via INTRAVENOUS
  Administered 2013-01-10: 10 mg via INTRAVENOUS

## 2013-01-10 MED ORDER — DEXAMETHASONE SODIUM PHOSPHATE 10 MG/ML IJ SOLN
10.0000 mg | INTRAMUSCULAR | Status: AC
  Administered 2013-01-10: 10 mg via INTRAVENOUS
  Filled 2013-01-10: qty 1

## 2013-01-10 MED ORDER — SODIUM CHLORIDE 0.9 % IJ SOLN: 3.0000 mL | Freq: Two times a day (BID) | INTRAMUSCULAR | Status: DC

## 2013-01-10 MED ORDER — HYDROCODONE-ACETAMINOPHEN 5-325 MG PO TABS: 1.0000 | ORAL_TABLET | ORAL | Status: DC | PRN

## 2013-01-10 MED ORDER — PHENOL 1.4 % MT LIQD: 1.0000 | OROMUCOSAL | Status: DC | PRN

## 2013-01-10 MED ORDER — ROCURONIUM BROMIDE 100 MG/10ML IV SOLN
INTRAVENOUS | Status: DC | PRN
  Administered 2013-01-10 (×2): 30 mg via INTRAVENOUS
  Administered 2013-01-10: 20 mg via INTRAVENOUS

## 2013-01-10 MED ORDER — LIDOCAINE HCL (CARDIAC) 20 MG/ML IV SOLN
INTRAVENOUS | Status: DC | PRN
  Administered 2013-01-10: 60 mg via INTRAVENOUS

## 2013-01-10 MED ORDER — ONDANSETRON HCL 4 MG/2ML IJ SOLN: 4.0000 mg | Freq: Once | INTRAMUSCULAR | Status: DC | PRN

## 2013-01-10 MED ORDER — ALUM & MAG HYDROXIDE-SIMETH 200-200-20 MG/5ML PO SUSP: 30.0000 mL | Freq: Four times a day (QID) | ORAL | Status: DC | PRN

## 2013-01-10 MED ORDER — CYCLOBENZAPRINE HCL 10 MG PO TABS: 10.0000 mg | ORAL_TABLET | Freq: Three times a day (TID) | ORAL | Status: DC | PRN

## 2013-01-10 MED ORDER — IRBESARTAN 300 MG PO TABS
300.0000 mg | ORAL_TABLET | Freq: Every day | ORAL | Status: DC
  Administered 2013-01-10: 300 mg via ORAL
  Filled 2013-01-10 (×2): qty 1

## 2013-01-10 MED ORDER — SODIUM CHLORIDE 0.9 % IV SOLN
INTRAVENOUS | Status: AC
  Filled 2013-01-10: qty 500

## 2013-01-10 MED ORDER — ONDANSETRON HCL 4 MG/2ML IJ SOLN
INTRAMUSCULAR | Status: DC | PRN
  Administered 2013-01-10: 4 mg via INTRAVENOUS

## 2013-01-10 MED ORDER — CEFAZOLIN SODIUM-DEXTROSE 2-3 GM-% IV SOLR
INTRAVENOUS | Status: AC
  Administered 2013-01-10: 2 g via INTRAVENOUS
  Filled 2013-01-10: qty 50

## 2013-01-10 MED ORDER — ACETAMINOPHEN 650 MG RE SUPP: 650.0000 mg | RECTAL | Status: DC | PRN

## 2013-01-10 MED ORDER — KETOROLAC TROMETHAMINE 30 MG/ML IJ SOLN
INTRAMUSCULAR | Status: DC | PRN
  Administered 2013-01-10: 30 mg via INTRAVENOUS

## 2013-01-10 MED ORDER — SENNA 8.6 MG PO TABS
1.0000 | ORAL_TABLET | Freq: Two times a day (BID) | ORAL | Status: DC
  Administered 2013-01-10: 8.6 mg via ORAL
  Filled 2013-01-10 (×3): qty 1

## 2013-01-10 MED ORDER — THROMBIN 20000 UNITS EX SOLR
CUTANEOUS | Status: DC | PRN
  Administered 2013-01-10: 13:00:00 via TOPICAL

## 2013-01-10 MED ORDER — 0.9 % SODIUM CHLORIDE (POUR BTL) OPTIME
TOPICAL | Status: DC | PRN
  Administered 2013-01-10: 1000 mL

## 2013-01-10 MED ORDER — OXYCODONE-ACETAMINOPHEN 5-325 MG PO TABS
1.0000 | ORAL_TABLET | ORAL | Status: DC | PRN
Start: 2013-01-10 — End: 2013-01-11
  Administered 2013-01-10 – 2013-01-11 (×2): 2 via ORAL
  Filled 2013-01-10: qty 2

## 2013-01-10 MED ORDER — ZOLPIDEM TARTRATE 5 MG PO TABS: 5.0000 mg | ORAL_TABLET | Freq: Every evening | ORAL | Status: DC | PRN

## 2013-01-10 MED ORDER — PROPOFOL 10 MG/ML IV BOLUS
INTRAVENOUS | Status: DC | PRN
  Administered 2013-01-10: 200 mg via INTRAVENOUS
  Administered 2013-01-10: 100 mg via INTRAVENOUS

## 2013-01-10 MED ORDER — CEFAZOLIN SODIUM 1-5 GM-% IV SOLN
1.0000 g | Freq: Three times a day (TID) | INTRAVENOUS | Status: AC
  Administered 2013-01-10 – 2013-01-11 (×2): 1 g via INTRAVENOUS
  Filled 2013-01-10 (×2): qty 50

## 2013-01-10 MED ORDER — HEMOSTATIC AGENTS (NO CHARGE) OPTIME
TOPICAL | Status: DC | PRN
  Administered 2013-01-10: 1 via TOPICAL

## 2013-01-10 MED ORDER — ACETAMINOPHEN 325 MG PO TABS: 650.0000 mg | ORAL_TABLET | ORAL | Status: DC | PRN

## 2013-01-10 MED ORDER — NEOSTIGMINE METHYLSULFATE 1 MG/ML IJ SOLN
INTRAMUSCULAR | Status: DC | PRN
  Administered 2013-01-10: 4 mg via INTRAVENOUS

## 2013-01-10 MED ORDER — BACITRACIN 50000 UNITS IM SOLR
INTRAMUSCULAR | Status: AC
  Filled 2013-01-10: qty 1

## 2013-01-10 MED ORDER — LACTATED RINGERS IV SOLN
INTRAVENOUS | Status: DC | PRN
  Administered 2013-01-10 (×2): via INTRAVENOUS

## 2013-01-10 MED ORDER — SODIUM CHLORIDE 0.9 % IJ SOLN: 3.0000 mL | INTRAMUSCULAR | Status: DC | PRN

## 2013-01-10 MED ORDER — HYDROMORPHONE HCL PF 1 MG/ML IJ SOLN
0.2500 mg | INTRAMUSCULAR | Status: DC | PRN
  Administered 2013-01-10 (×5): 0.5 mg via INTRAVENOUS

## 2013-01-10 MED ORDER — HYDROMORPHONE HCL PF 1 MG/ML IJ SOLN
0.2500 mg | INTRAMUSCULAR | Status: DC | PRN
  Administered 2013-01-10: 0.5 mg via INTRAVENOUS

## 2013-01-10 MED ORDER — BUPIVACAINE HCL (PF) 0.25 % IJ SOLN
INTRAMUSCULAR | Status: DC | PRN
  Administered 2013-01-10: 20 mL

## 2013-01-10 MED ORDER — FENTANYL CITRATE 0.05 MG/ML IJ SOLN
INTRAMUSCULAR | Status: DC | PRN
  Administered 2013-01-10: 75 ug via INTRAVENOUS
  Administered 2013-01-10: 175 ug via INTRAVENOUS
  Administered 2013-01-10 (×2): 50 ug via INTRAVENOUS

## 2013-01-10 MED ORDER — OXYCODONE-ACETAMINOPHEN 5-325 MG PO TABS
ORAL_TABLET | ORAL | Status: AC
  Filled 2013-01-10: qty 2

## 2013-01-10 MED ORDER — ARTIFICIAL TEARS OP OINT
TOPICAL_OINTMENT | OPHTHALMIC | Status: DC | PRN
  Administered 2013-01-10: 1 via OPHTHALMIC

## 2013-01-10 MED ORDER — MENTHOL 3 MG MT LOZG: 1.0000 | LOZENGE | OROMUCOSAL | Status: DC | PRN

## 2013-01-10 MED ORDER — GLYCOPYRROLATE 0.2 MG/ML IJ SOLN
INTRAMUSCULAR | Status: DC | PRN
  Administered 2013-01-10: 0.6 mg via INTRAVENOUS
  Administered 2013-01-10: 0.2 mg via INTRAVENOUS

## 2013-01-10 SURGICAL SUPPLY — 58 items
BAG DECANTER FOR FLEXI CONT (MISCELLANEOUS) ×2 IMPLANT
BENZOIN TINCTURE PRP APPL 2/3 (GAUZE/BANDAGES/DRESSINGS) ×2 IMPLANT
BLADE SURG ROTATE 9660 (MISCELLANEOUS) IMPLANT
BRUSH SCRUB EZ PLAIN DRY (MISCELLANEOUS) ×2 IMPLANT
BUR CUTTER 7.0 ROUND (BURR) ×2 IMPLANT
CANISTER SUCTION 2500CC (MISCELLANEOUS) ×2 IMPLANT
CLOTH BEACON ORANGE TIMEOUT ST (SAFETY) ×2 IMPLANT
CONT SPEC 4OZ CLIKSEAL STRL BL (MISCELLANEOUS) ×2 IMPLANT
DECANTER SPIKE VIAL GLASS SM (MISCELLANEOUS) ×2 IMPLANT
DERMABOND ADHESIVE PROPEN (GAUZE/BANDAGES/DRESSINGS) ×1
DERMABOND ADVANCED (GAUZE/BANDAGES/DRESSINGS)
DERMABOND ADVANCED .7 DNX12 (GAUZE/BANDAGES/DRESSINGS) IMPLANT
DERMABOND ADVANCED .7 DNX6 (GAUZE/BANDAGES/DRESSINGS) ×1 IMPLANT
DRAPE LAPAROTOMY 100X72X124 (DRAPES) ×2 IMPLANT
DRAPE MICROSCOPE ZEISS OPMI (DRAPES) ×2 IMPLANT
DRAPE POUCH INSTRU U-SHP 10X18 (DRAPES) ×2 IMPLANT
DRAPE PROXIMA HALF (DRAPES) IMPLANT
DRAPE SURG 17X23 STRL (DRAPES) ×4 IMPLANT
DURAPREP 26ML APPLICATOR (WOUND CARE) ×2 IMPLANT
ELECT REM PT RETURN 9FT ADLT (ELECTROSURGICAL) ×2
ELECTRODE REM PT RTRN 9FT ADLT (ELECTROSURGICAL) ×1 IMPLANT
EVACUATOR 1/8 PVC DRAIN (DRAIN) ×2 IMPLANT
GAUZE SPONGE 4X4 16PLY XRAY LF (GAUZE/BANDAGES/DRESSINGS) IMPLANT
GLOVE BIOGEL PI IND STRL 7.0 (GLOVE) ×1 IMPLANT
GLOVE BIOGEL PI IND STRL 7.5 (GLOVE) ×2 IMPLANT
GLOVE BIOGEL PI IND STRL 8.5 (GLOVE) ×1 IMPLANT
GLOVE BIOGEL PI INDICATOR 7.0 (GLOVE) ×1
GLOVE BIOGEL PI INDICATOR 7.5 (GLOVE) ×2
GLOVE BIOGEL PI INDICATOR 8.5 (GLOVE) ×1
GLOVE ECLIPSE 7.5 STRL STRAW (GLOVE) ×8 IMPLANT
GLOVE ECLIPSE 8.5 STRL (GLOVE) ×2 IMPLANT
GLOVE EXAM NITRILE LRG STRL (GLOVE) IMPLANT
GLOVE EXAM NITRILE MD LF STRL (GLOVE) IMPLANT
GLOVE EXAM NITRILE XL STR (GLOVE) IMPLANT
GLOVE EXAM NITRILE XS STR PU (GLOVE) IMPLANT
GOWN BRE IMP SLV AUR LG STRL (GOWN DISPOSABLE) ×2 IMPLANT
GOWN BRE IMP SLV AUR XL STRL (GOWN DISPOSABLE) ×4 IMPLANT
GOWN STRL REIN 2XL LVL4 (GOWN DISPOSABLE) ×2 IMPLANT
KIT BASIN OR (CUSTOM PROCEDURE TRAY) ×2 IMPLANT
KIT ROOM TURNOVER OR (KITS) ×2 IMPLANT
NEEDLE HYPO 22GX1.5 SAFETY (NEEDLE) ×2 IMPLANT
NEEDLE SPNL 22GX3.5 QUINCKE BK (NEEDLE) ×2 IMPLANT
NS IRRIG 1000ML POUR BTL (IV SOLUTION) ×2 IMPLANT
PACK LAMINECTOMY NEURO (CUSTOM PROCEDURE TRAY) ×2 IMPLANT
PAD ARMBOARD 7.5X6 YLW CONV (MISCELLANEOUS) ×10 IMPLANT
RUBBERBAND STERILE (MISCELLANEOUS) ×4 IMPLANT
SENSORCAINE 0.25% ×2 IMPLANT
SPONGE GAUZE 4X4 12PLY (GAUZE/BANDAGES/DRESSINGS) ×2 IMPLANT
SPONGE SURGIFOAM ABS GEL 100 (HEMOSTASIS) ×2 IMPLANT
SPONGE SURGIFOAM ABS GEL SZ50 (HEMOSTASIS) ×2 IMPLANT
STRIP CLOSURE SKIN 1/2X4 (GAUZE/BANDAGES/DRESSINGS) ×2 IMPLANT
SUT VIC AB 2-0 CT1 18 (SUTURE) ×2 IMPLANT
SUT VIC AB 3-0 SH 8-18 (SUTURE) ×2 IMPLANT
SYR 20ML ECCENTRIC (SYRINGE) ×2 IMPLANT
TAPE CLOTH SURG 4X10 WHT LF (GAUZE/BANDAGES/DRESSINGS) ×2 IMPLANT
TOWEL OR 17X24 6PK STRL BLUE (TOWEL DISPOSABLE) ×2 IMPLANT
TOWEL OR 17X26 10 PK STRL BLUE (TOWEL DISPOSABLE) ×2 IMPLANT
WATER STERILE IRR 1000ML POUR (IV SOLUTION) ×2 IMPLANT

## 2013-01-10 NOTE — Transfer of Care (Signed)
Immediate Anesthesia Transfer of Care Note  Patient: John Molina  Procedure(s) Performed: Procedure(s) with comments: LUMBAR LAMINECTOMY/DECOMPRESSION MICRODISCECTOMY 2 LEVELS (Bilateral) - Bilateral lumbar three-four,Lumbar four-five  Patient Location: PACU  Anesthesia Type:General  Level of Consciousness: awake, alert  and oriented  Airway & Oxygen Therapy: Patient Spontanous Breathing and Patient connected to face mask oxygen  Post-op Assessment: Report given to PACU RN and Patient moving all extremities X 4  Post vital signs: Reviewed and stable  Complications: No apparent anesthesia complications

## 2013-01-10 NOTE — Progress Notes (Signed)
Dr. Jordan Likes at bedside, talked to pt. Aware about pain level of 8/10.

## 2013-01-10 NOTE — Anesthesia Procedure Notes (Signed)
Procedure Name: Intubation Date/Time: 01/10/2013 11:32 AM Performed by: Jefm Miles E Pre-anesthesia Checklist: Patient identified, Timeout performed, Emergency Drugs available, Suction available and Patient being monitored Patient Re-evaluated:Patient Re-evaluated prior to inductionOxygen Delivery Method: Circle system utilized Preoxygenation: Pre-oxygenation with 100% oxygen Intubation Type: IV induction Ventilation: Mask ventilation without difficulty and Oral airway inserted - appropriate to patient size Laryngoscope Size: Mac and 4 Grade View: Grade I Tube type: Oral Tube size: 7.5 mm Number of attempts: 1 Airway Equipment and Method: Stylet Placement Confirmation: ETT inserted through vocal cords under direct vision,  breath sounds checked- equal and bilateral and positive ETCO2 Secured at: 21 cm Tube secured with: Tape Dental Injury: Teeth and Oropharynx as per pre-operative assessment

## 2013-01-10 NOTE — Preoperative (Signed)
Beta Blockers   Reason not to administer Beta Blockers:Not Applicable 

## 2013-01-10 NOTE — Op Note (Signed)
Date of procedure: 01/10/2013  Date of dictation: Same  Service: Neurosurgery  Preoperative diagnosis: L3-4, L4-5 stenosis with neurogenic claudication  Postoperative diagnosis: Same  Procedure Name: Bilateral L3-4 laminotomies and medial facetectomies with bilateral L3-L4 and L5 decompressive foraminotomies  Surgeon:Henrik Orihuela A.Latisa Belay, M.D.  Asst. Surgeon: Gerlene Fee  Anesthesia: General  Indication: 72 year old male with back and severe bilateral lower extremity pain and paresthesias consistent with neurogenic claudication and failing conservative management. Workup demonstrates evidence of significant spondylosis and stenosis at L3-4 L4-5. Patient is now for 2 level decompressive surgery in hopes of improving his symptoms.  Operative note: After induction anesthesia, patient positioned prone onto Wilson frame and appropriately padded. Lumbar region prepped and draped. Incision made overlying the L4 level. Bilateral subperiosteal dissection performed exposing the lamina facet joints of L3-L4 and L5. Self-retaining retractors placed x-ray taken level confirmed. Decompressive laminotomies then performed using high-speed drill and Kerrison rongeurs to median tear aspect of lamina above the medial aspect of the facet joint and the superior rim of the lamina below bilaterally at L3-4 and L4-5. Ligament flavum was elevated and resected piecemeal fashion. Gutters were undercut and decompressive foraminotomies were performed along the course exiting L3-L4 and L5 nerve roots bilaterally. At this point a very thorough decompression achieved. The spaces weren't expected at both levels. There is no gross soft herniation and nothing that I felt that discectomy would improve overall. Wounds that area the like solution. A medium Hemovac drain was left in epidural space. Wounds and close in layers with Vicryl sutures. Steri-Strips and sterile dressing were applied. There were no apparent complications. Patient  tolerated the procedure well and he returns they're covering postop.

## 2013-01-10 NOTE — Brief Op Note (Signed)
01/10/2013  1:54 PM  PATIENT:  John Molina  72 y.o. male  PRE-OPERATIVE DIAGNOSIS:  stenosis  POST-OPERATIVE DIAGNOSIS:  stenosis  PROCEDURE:  Procedure(s) with comments: LUMBAR LAMINECTOMY/DECOMPRESSION MICRODISCECTOMY 2 LEVELS (Bilateral) - Bilateral lumbar three-four,Lumbar four-five  SURGEON:  Surgeon(s) and Role:    * Temple Pacini, MD - Primary    * Reinaldo Meeker, MD - Assisting  PHYSICIAN ASSISTANT:   ASSISTANTS:    ANESTHESIA:   general  EBL:  Total I/O In: 1000 [I.V.:1000] Out: 300 [Blood:300]  BLOOD ADMINISTERED:none  DRAINS: (Medium) Hemovact drain(s) in the Epidural space with  Suction Open   LOCAL MEDICATIONS USED:  MARCAINE     SPECIMEN:  No Specimen  DISPOSITION OF SPECIMEN:  N/A  COUNTS:  YES  TOURNIQUET:  * No tourniquets in log *  DICTATION: .Dragon Dictation  PLAN OF CARE: Admit for overnight observation  PATIENT DISPOSITION:  PACU - hemodynamically stable.   Delay start of Pharmacological VTE agent (>24hrs) due to surgical blood loss or risk of bleeding: yes

## 2013-01-10 NOTE — Progress Notes (Signed)
Pt's wife stated that patient has a history of sleep apnea.  Sleep study completed in 2002 in [place].  Pt received a Cpap machine at the time in 2002 but reports he never used machine then and does not currently use CPAP.

## 2013-01-10 NOTE — Plan of Care (Signed)
Problem: Consults Goal: Diagnosis - Spinal Surgery Outcome: Completed/Met Date Met:  01/10/13 Lumbar Laminectomy (Complex)

## 2013-01-10 NOTE — H&P (Signed)
John Molina is an 72 y.o. male.   Chief Complaint: Leg pain HPI: 72 year old male with progressive bilateral lower trimming a pain and paresthesias consistent with neurogenic claudication. Patient's symptoms have failed conservative management. Workup demonstrates evidence of moderately severe stenosis at L3-4 and L4-5. Patient presents now for decompressive surgery.  Past Medical History  Diagnosis Date  . Hypertension   . Colon cancer   . Arthritis   . Anemia   . Chronic back pain   . Depression   . Pneumonia     hx of  . Hepatitis     Hep C  . Sleep apnea     Past Surgical History  Procedure Laterality Date  . Colon resection  08/1998  . Hernia repair    . Appendectomy    . Knee surgery    . Vasectomy    . Cataract extraction      bilateral  . Shoulder open rotator cuff repair Right 11/07/2012    Procedure: ROTATOR CUFF REPAIR SHOULDER OPEN;  Surgeon: Darreld Mclean, MD;  Location: [facility];  Service: Orthopedics;  Laterality: Right;  Right Open Rotator Cuff Repair  . Shoulder acromioplasty Right 11/07/2012    Procedure: SHOULDER ACROMIOPLASTY;  Surgeon: Darreld Mclean, MD;  Location: [facility];  Service: Orthopedics;  Laterality: Right;  . Portacath placement      and removal    No family history on file. Social History:  reports that he has never smoked. He does not have any smokeless tobacco history on file. He reports that he does not drink alcohol or use illicit drugs.  Allergies: No Known Allergies  Medications Prior to Admission  Medication Sig Dispense Refill  . fluocinolone (SYNALAR) 0.025 % cream Apply 1 application topically 3 (three) times daily as needed (psoriasis).      . Menthol-Camphor (TIGER BALM ARTHRITIS RUB EX) Apply 1 application topically as needed (arm pain).      Marland Kitchen olmesartan (BENICAR) 40 MG tablet Take 40 mg by mouth daily.      Marland Kitchen oxyCODONE (ROXICODONE) 15 MG immediate release tablet Take 15-30 mg by mouth every 6 (six) hours as needed for pain.         Results for orders placed during the hospital encounter of 01/09/13 (from the past 48 hour(s))  SURGICAL PCR SCREEN     Status: None   Collection Time    01/09/13 10:21 AM      Result Value Range   MRSA, PCR NEGATIVE  NEGATIVE   Staphylococcus aureus NEGATIVE  NEGATIVE   Comment:            The Xpert SA Assay (FDA     approved for NASAL specimens     in patients over 33 years of age),     is one component of     a comprehensive surveillance     program.  Test performance has     been validated by [employer] for patients greater     than or equal to 70 year old.     It is not intended     to diagnose infection nor to     guide or monitor treatment.  CBC WITH DIFFERENTIAL     Status: Abnormal   Collection Time    01/09/13 10:23 AM      Result Value Range   WBC 4.8  4.0 - 10.5 K/uL   RBC 4.07 (*) 4.22 - 5.81 MIL/uL   Hemoglobin 12.3 (*)  13.0 - 17.0 g/dL   HCT 16.1 (*) 09.6 - 04.5 %   MCV 85.3  78.0 - 100.0 fL   MCH 30.2  26.0 - 34.0 pg   MCHC 35.4  30.0 - 36.0 g/dL   RDW 40.9  81.1 - 91.4 %   Platelets 211  150 - 400 K/uL   Neutrophils Relative 42 (*) 43 - 77 %   Neutro Abs 2.0  1.7 - 7.7 K/uL   Lymphocytes Relative 42  12 - 46 %   Lymphs Abs 2.0  0.7 - 4.0 K/uL   Monocytes Relative 11  3 - 12 %   Monocytes Absolute 0.5  0.1 - 1.0 K/uL   Eosinophils Relative 4  0 - 5 %   Eosinophils Absolute 0.2  0.0 - 0.7 K/uL   Basophils Relative 1  0 - 1 %   Basophils Absolute 0.1  0.0 - 0.1 K/uL  COMPREHENSIVE METABOLIC PANEL     Status: Abnormal   Collection Time    01/09/13 10:23 AM      Result Value Range   Sodium 141  135 - 145 mEq/L   Potassium 3.8  3.5 - 5.1 mEq/L   Chloride 104  96 - 112 mEq/L   CO2 28  19 - 32 mEq/L   Glucose, Bld 86  70 - 99 mg/dL   BUN 10  6 - 23 mg/dL   Creatinine, Ser 7.82  0.50 - 1.35 mg/dL   Calcium 9.7  8.4 - 95.6 mg/dL   Total Protein 7.9  6.0 - 8.3 g/dL   Albumin 4.2  3.5 - 5.2 g/dL   AST 25  0 - 37 U/L   ALT 26  0 - 53 U/L    Alkaline Phosphatase 55  39 - 117 U/L   Total Bilirubin 0.4  0.3 - 1.2 mg/dL   GFR calc non Af Amer 87 (*) >90 mL/min   GFR calc Af Amer >90  >90 mL/min   Comment:            The eGFR has been calculated     using the CKD EPI equation.     This calculation has not been     validated in all clinical     situations.     eGFR's persistently     <90 mL/min signify     possible Chronic Kidney Disease.   Dg Chest 2 View  01/09/2013  *RADIOLOGY REPORT*  Clinical Data: Hypertension.  CHEST - 2 VIEW  Comparison: October 20, 2012.  Findings: Cardiomediastinal silhouette appears normal.  No acute pulmonary disease is noted.  Bony thorax is intact.  IMPRESSION: No acute cardiopulmonary abnormality seen.   Original Report Authenticated By: Lupita Raider.,  M.D.     Review of Systems  Constitutional: Negative.   HENT: Negative.   Eyes: Negative.   Respiratory: Negative.   Cardiovascular: Negative.   Gastrointestinal: Negative.   Genitourinary: Negative.   Musculoskeletal: Negative.   Skin: Negative.   Neurological: Negative.   Endo/Heme/Allergies: Negative.   Psychiatric/Behavioral: Negative.     Blood pressure 158/82, pulse 5, temperature 97.1 F (36.2 C), temperature source Oral, resp. rate 20, SpO2 96.00%. Physical Exam  Constitutional: He is oriented to person, place, and time. He appears well-developed and well-nourished. No distress.  HENT:  Head: Normocephalic and atraumatic.  Right Ear: External ear normal.  Left Ear: External ear normal.  Nose: Nose normal.  Mouth/Throat: Oropharynx is clear and moist.  Eyes: Conjunctivae and  EOM are normal. Pupils are equal, round, and reactive to light. Right eye exhibits no discharge. Left eye exhibits no discharge.  Neck: Normal range of motion. Neck supple. No tracheal deviation present. No thyromegaly present.  Cardiovascular: Normal rate, regular rhythm, normal heart sounds and intact distal pulses.  Exam reveals no friction rub.   No  murmur heard. Respiratory: Effort normal and breath sounds normal. No respiratory distress. He has no wheezes.  GI: Soft. Bowel sounds are normal. He exhibits no distension. There is no tenderness.  Musculoskeletal: Normal range of motion. He exhibits no edema and no tenderness.  Neurological: He is alert and oriented to person, place, and time. He has normal reflexes. No cranial nerve deficit. Coordination normal.  Skin: Skin is warm and dry. No rash noted. He is not diaphoretic. No erythema.  Psychiatric: He has a normal mood and affect. His behavior is normal. Judgment and thought content normal.     Assessment/Plan L3-4, L4-5 spinal stenosis with neurogenic claudication. Plan bilateral L3-4 and L4-5 decompressive laminotomies and foraminotomies. Risks and benefits have been explained. Patient wishes to proceed.  Prestyn Mahn A 01/10/2013, 9:49 AM

## 2013-01-10 NOTE — Anesthesia Preprocedure Evaluation (Addendum)
Anesthesia Evaluation  Patient identified by MRN, date of birth, ID band Patient awake    Reviewed: Allergy & Precautions, H&P , NPO status , Patient's Chart, lab work & pertinent test results  Airway Mallampati: I TM Distance: >3 FB Neck ROM: full    Dental  (+) Edentulous Upper, Upper Dentures and Dental Advisory Given   Pulmonary sleep apnea ,          Cardiovascular hypertension, Rhythm:regular Rate:Normal     Neuro/Psych Depression    GI/Hepatic (+) Hepatitis -  Endo/Other    Renal/GU      Musculoskeletal   Abdominal   Peds  Hematology   Anesthesia Other Findings Medication abuse hx  Reproductive/Obstetrics                          Anesthesia Physical Anesthesia Plan  ASA: II  Anesthesia Plan: General   Post-op Pain Management:    Induction: Intravenous  Airway Management Planned: Oral ETT  Additional Equipment:   Intra-op Plan:   Post-operative Plan: Extubation in OR  Informed Consent: I have reviewed the patients History and Physical, chart, labs and discussed the procedure including the risks, benefits and alternatives for the proposed anesthesia with the patient or authorized representative who has indicated his/her understanding and acceptance.     Plan Discussed with: CRNA, Anesthesiologist and Surgeon  Anesthesia Plan Comments:         Anesthesia Quick Evaluation

## 2013-01-10 NOTE — Progress Notes (Signed)
Dr. Ivin Booty at bedside. Pt. still has pain 8/10, received 2mg  of dilaudid IV and Percocet 5/325mg -2 po. Order given for more Dilaudid 2mg  IV.

## 2013-01-10 NOTE — Anesthesia Postprocedure Evaluation (Signed)
  Anesthesia Post-op Note  Patient: John Molina  Procedure(s) Performed: Procedure(s) with comments: LUMBAR LAMINECTOMY/DECOMPRESSION MICRODISCECTOMY 2 LEVELS (Bilateral) - Bilateral lumbar three-four,Lumbar four-five  Patient Location: PACU  Anesthesia Type:General  Level of Consciousness: awake, oriented and patient cooperative  Airway and Oxygen Therapy: Patient Spontanous Breathing  Post-op Pain: mild  Post-op Assessment: Post-op Vital signs reviewed, Patient's Cardiovascular Status Stable, Respiratory Function Stable, Patent Airway, No signs of Nausea or vomiting and Pain level controlled  Post-op Vital Signs: stable  Complications: No apparent anesthesia complications

## 2013-01-11 MED ORDER — CYCLOBENZAPRINE HCL 10 MG PO TABS: 10.0000 mg | ORAL_TABLET | Freq: Three times a day (TID) | ORAL | Status: DC | PRN

## 2013-01-11 MED ORDER — OXYCODONE HCL 15 MG PO TABS: 15.0000 mg | ORAL_TABLET | Freq: Four times a day (QID) | ORAL | Status: DC | PRN

## 2013-01-11 NOTE — Discharge Summary (Signed)
Physician Discharge Summary  Patient ID: John Molina MRN: [id number] DOB/AGE: [birth year] 72 y.o.  Admit date: 01/10/2013 Discharge date: 01/11/2013  Admission Diagnoses: L3-4 and L4-5 spinal stenosis, lumbar radiculopathy, lumbago, neurogenic claudication  Discharge Diagnoses: The same Principal Problem:   Spinal stenosis, lumbar region, with neurogenic claudication   Discharged Condition: good  Hospital Course: Dr. Dutch Quint admitted the patient on 01/10/2013. On that day performed at L3-4 and L4-5 decompressive laminectomy.  The patient's postoperative course was unremarkable. On postop day #1 he requested discharge to home. The patient was given oral and written discharge instructions. All his questions were answered.  Consults: None Significant Diagnostic Studies: None Treatments: L3-4 and L4-5 laminectomy. Discharge Exam: Blood pressure 133/71, pulse 82, temperature 98.2 F (36.8 C), temperature source Oral, resp. rate 18, SpO2 95.00%. The patient is alert and pleasant. He looks well. His dressing is clean and dry. His lower extremity strength is grossly normal.  Disposition: Home  Discharge Orders   Future Orders Complete By Expires     Call MD for:  difficulty breathing, headache or visual disturbances  As directed     Call MD for:  extreme fatigue  As directed     Call MD for:  hives  As directed     Call MD for:  persistant dizziness or light-headedness  As directed     Call MD for:  persistant nausea and vomiting  As directed     Call MD for:  redness, tenderness, or signs of infection (pain, swelling, redness, odor or green/yellow discharge around incision site)  As directed     Call MD for:  severe uncontrolled pain  As directed     Call MD for:  temperature >100.4  As directed     Diet - low sodium heart healthy  As directed     Discharge instructions  As directed     Comments:      Call [phone] for a followup appointment. Take a stool softener while you  are using pain medications.    Driving Restrictions  As directed     Comments:      Do not drive for 2 weeks.    Increase activity slowly  As directed     Lifting restrictions  As directed     Comments:      Do not lift more than 5 pounds. No excessive bending or twisting.    May shower / Bathe  As directed     Comments:      He may shower after the pain she is removed 3 days after surgery. Leave the incision alone.    Remove dressing in 48 hours  As directed     Comments:      Your stitches are under the scan and will dissolve by themselves. The Steri-Strips will fall off after you take a few showers. Do not rub back or pick at the wound, Leave the wound alone.        Medication List    TAKE these medications       cyclobenzaprine 10 MG tablet  Commonly known as:  FLEXERIL  Take 1 tablet (10 mg total) by mouth 3 (three) times daily as needed for muscle spasms.     fluocinolone 0.025 % cream  Commonly known as:  SYNALAR  Apply 1 application topically 3 (three) times daily as needed (psoriasis).     olmesartan 40 MG tablet  Commonly known as:  BENICAR  Take 40 mg by mouth  daily.     oxyCODONE 15 MG immediate release tablet  Commonly known as:  ROXICODONE  Take 1-2 tablets (15-30 mg total) by mouth every 6 (six) hours as needed.     oxyCODONE 15 MG immediate release tablet  Commonly known as:  ROXICODONE  Take 15-30 mg by mouth every 6 (six) hours as needed for pain.     TIGER BALM ARTHRITIS RUB EX  Apply 1 application topically as needed (arm pain).         SignedCristi Loron 01/11/2013, 7:40 AM

## 2013-01-11 NOTE — Progress Notes (Signed)
Utilization review completed.  P.J. Jadrian Bulman,RN,BSN Case Manager [phone]  

## 2013-01-14 ENCOUNTER — Encounter (HOSPITAL_COMMUNITY): Payer: Self-pay | Admitting: Neurosurgery

## 2013-01-29 DIAGNOSIS — I10 Essential (primary) hypertension: Secondary | ICD-10-CM | POA: Diagnosis not present

## 2013-01-29 DIAGNOSIS — E782 Mixed hyperlipidemia: Secondary | ICD-10-CM | POA: Diagnosis not present

## 2013-02-14 DIAGNOSIS — M25519 Pain in unspecified shoulder: Secondary | ICD-10-CM | POA: Diagnosis not present

## 2013-02-20 DIAGNOSIS — M25519 Pain in unspecified shoulder: Secondary | ICD-10-CM | POA: Diagnosis not present

## 2013-02-25 DIAGNOSIS — M25519 Pain in unspecified shoulder: Secondary | ICD-10-CM | POA: Diagnosis not present

## 2013-02-26 ENCOUNTER — Other Ambulatory Visit (HOSPITAL_COMMUNITY): Payer: Self-pay | Admitting: Internal Medicine

## 2013-02-26 DIAGNOSIS — R0989 Other specified symptoms and signs involving the circulatory and respiratory systems: Secondary | ICD-10-CM

## 2013-02-26 DIAGNOSIS — E782 Mixed hyperlipidemia: Secondary | ICD-10-CM | POA: Diagnosis not present

## 2013-02-26 DIAGNOSIS — I10 Essential (primary) hypertension: Secondary | ICD-10-CM | POA: Diagnosis not present

## 2013-02-27 ENCOUNTER — Encounter: Payer: Self-pay | Admitting: Neurology

## 2013-02-27 ENCOUNTER — Ambulatory Visit (HOSPITAL_COMMUNITY)
Admission: RE | Admit: 2013-02-27 | Discharge: 2013-02-27 | Disposition: A | Discharge status: Home / Self Care | Payer: Medicare Other | Source: Ambulatory Visit | Attending: Internal Medicine | Admitting: Internal Medicine

## 2013-02-27 DIAGNOSIS — I6529 Occlusion and stenosis of unspecified carotid artery: Secondary | ICD-10-CM | POA: Insufficient documentation

## 2013-02-27 DIAGNOSIS — I658 Occlusion and stenosis of other precerebral arteries: Secondary | ICD-10-CM | POA: Diagnosis not present

## 2013-02-27 DIAGNOSIS — R0989 Other specified symptoms and signs involving the circulatory and respiratory systems: Secondary | ICD-10-CM

## 2013-02-27 DIAGNOSIS — I10 Essential (primary) hypertension: Secondary | ICD-10-CM | POA: Insufficient documentation

## 2013-02-27 DIAGNOSIS — M25519 Pain in unspecified shoulder: Secondary | ICD-10-CM | POA: Diagnosis not present

## 2013-02-28 ENCOUNTER — Encounter: Payer: Self-pay | Admitting: Neurology

## 2013-02-28 ENCOUNTER — Ambulatory Visit (INDEPENDENT_AMBULATORY_CARE_PROVIDER_SITE_OTHER): Payer: Medicare Other | Admitting: Neurology

## 2013-02-28 VITALS — BP 112/57 | HR 55 | Ht 67.0 in | Wt 188.0 lb

## 2013-02-28 DIAGNOSIS — R259 Unspecified abnormal involuntary movements: Secondary | ICD-10-CM

## 2013-02-28 DIAGNOSIS — R251 Tremor, unspecified: Secondary | ICD-10-CM | POA: Insufficient documentation

## 2013-02-28 DIAGNOSIS — M545 Low back pain, unspecified: Secondary | ICD-10-CM | POA: Diagnosis not present

## 2013-02-28 MED ORDER — CITALOPRAM HYDROBROMIDE 10 MG PO TABS: 10.0000 mg | ORAL_TABLET | Freq: Every day | ORAL | Status: DC

## 2013-02-28 NOTE — Progress Notes (Signed)
History of present illness:  John Molina is a 72 years old right-handed Caucasian male, accompanied by his wife, referred by his neurosurgeon Dr. Lelon Perla, and also primary care physician for evaluation of tremor   He had a past medical history of hypertension, hyperlipidemia, anxiety, underwent 2 surgery in 2014, he had right rotator cuff surgery February 2014 by Dr. Darreld Mclean, he also had bilateral L3-4 laminotomies and medial facetectomies with bilateral L3-L4 and L5 decompressive foraminotomies by Dr. 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, has worked all his life, complains of worsening anxiety since he retired, worsened with his health issues, since his right shoulder surgery in February 2013, when he raised his right arm he noticed large amplitude uncontrollable right arm shaking, getting worse over the past few months, now involving both arm, when he stands, he also began to have bilateral leg shaking, no loss of consciousness, no self injury, he claims, he has no control consideration, but during today's interview, he has cresendal increased large amplitude bilateral arm, and leg shaking, lasting for a few minutes, then stopped abruptly,  He complains of gait difficulty due to continued low back pain, left foot numbness, he denies bowel and bladder incontinence. He has mild constipation due to his chronic pain medications, there was no family history of tremor, he denies loss sense of smell,   Review of Systems  Out of a complete 14 system review, the patient complains of only the following symptoms, and all other reviewed systems are negative.   Constitutional:   Weight gain, fatigue Cardiovascular:  N/A Ear/Nose/Throat:  Hearing loss, ringing in ears Skin: N/A Eyes: N/A Respiratory: Snoring Gastroitestinal: Constipation   Hematology/Lymphatic:  N/A Endocrine:  N/A Musculoskeletal: achy  muscles Allergy/Immunology: N/A Neurological: Memory loss, numbness, weakness, tremor, snoring Psychiatric:    Decreased energy.  PHYSICAL EXAMINATOINS:  Generalized: In no acute distress  Neck: Supple, no carotid bruits   Cardiac: Regular rate rhythm  Pulmonary: Clear to auscultation bilaterally  Musculoskeletal: No deformity  Neurological examination  Mentation: Alert oriented to time, place, history taking, and causual conversation  Cranial nerve II-XII: Pupils were equal round reactive to light extraocular movements were full, visual field were full on confrontational test. facial sensation and strength were normal. hearing was intact to finger rubbing bilaterally. Uvula tongue midline.  head turning and shoulder shrug and were normal and symmetric.Tongue protrusion into cheek strength was normal.  Motor: normal tone, bulk and strength. He had bilateral arm and leg cresendo increased large amplitude shaking, can stop abruptly, and also can decreased the amplitude of the shaking with reminding  Sensory: Intact to fine touch, pinprick, preserved vibratory sensation, and proprioception at toes.  Coordination: Normal finger to nose, heel-to-shin bilaterally there was no truncal ataxia  Gait: Rising up from seated position without assistance, splint his lumbar paraspinal muscles  Romberg signs: Negative  Deep tendon reflexes: Brachioradialis 2/2, biceps 2/2, triceps 2/2, patellar 2/2, Achilles 2/2, plantar responses were flexor bilaterally.  Assessment and plan: 72 years old right-handed Caucasian male, with a history of anxiety disorder, now presenting with postural tremor, there was no family history tremor, no parkinsonian features,  1 could be related to his anxiety, I have suggested Celexa, 2.  Return to clinic in 4-6 months,.

## 2013-03-04 DIAGNOSIS — M25519 Pain in unspecified shoulder: Secondary | ICD-10-CM | POA: Diagnosis not present

## 2013-03-13 ENCOUNTER — Telehealth: Payer: Self-pay

## 2013-03-13 NOTE — Telephone Encounter (Signed)
Pt was referred for screening colonoscopy by Dr. Dwana Melena. Called and [facility] for a return call.

## 2013-03-14 NOTE — Telephone Encounter (Signed)
LMOM to call.

## 2013-03-18 NOTE — Telephone Encounter (Signed)
Letter to pt and PCP.  

## 2013-04-03 DIAGNOSIS — I10 Essential (primary) hypertension: Secondary | ICD-10-CM | POA: Diagnosis not present

## 2013-04-03 DIAGNOSIS — E781 Pure hyperglyceridemia: Secondary | ICD-10-CM | POA: Diagnosis not present

## 2013-04-03 DIAGNOSIS — E785 Hyperlipidemia, unspecified: Secondary | ICD-10-CM | POA: Diagnosis not present

## 2013-04-03 DIAGNOSIS — G8929 Other chronic pain: Secondary | ICD-10-CM | POA: Diagnosis not present

## 2013-04-09 ENCOUNTER — Telehealth: Payer: Self-pay

## 2013-04-29 NOTE — Telephone Encounter (Signed)
Pt is scheduled for OV with LL on 05/06/2013 at 10:00 AM/ hx of colon cancer.

## 2013-04-30 ENCOUNTER — Other Ambulatory Visit: Payer: Self-pay | Admitting: Neurosurgery

## 2013-04-30 DIAGNOSIS — M4800 Spinal stenosis, site unspecified: Secondary | ICD-10-CM

## 2013-05-02 DIAGNOSIS — M25519 Pain in unspecified shoulder: Secondary | ICD-10-CM | POA: Diagnosis not present

## 2013-05-05 ENCOUNTER — Ambulatory Visit
Admission: RE | Admit: 2013-05-05 | Discharge: 2013-05-05 | Disposition: A | Discharge status: Home / Self Care | Payer: Medicare Other | Source: Ambulatory Visit | Attending: Neurosurgery | Admitting: Neurosurgery

## 2013-05-05 DIAGNOSIS — M5126 Other intervertebral disc displacement, lumbar region: Secondary | ICD-10-CM | POA: Diagnosis not present

## 2013-05-05 DIAGNOSIS — M47817 Spondylosis without myelopathy or radiculopathy, lumbosacral region: Secondary | ICD-10-CM | POA: Diagnosis not present

## 2013-05-05 DIAGNOSIS — M4800 Spinal stenosis, site unspecified: Secondary | ICD-10-CM

## 2013-05-05 MED ORDER — GADOBENATE DIMEGLUMINE 529 MG/ML IV SOLN
9.0000 mL | Freq: Once | INTRAVENOUS | Status: AC | PRN
  Administered 2013-05-05: 9 mL via INTRAVENOUS

## 2013-05-06 ENCOUNTER — Encounter (HOSPITAL_COMMUNITY): Payer: Self-pay | Admitting: Pharmacy Technician

## 2013-05-06 ENCOUNTER — Other Ambulatory Visit: Payer: Self-pay | Admitting: Gastroenterology

## 2013-05-06 ENCOUNTER — Encounter: Payer: Self-pay | Admitting: Gastroenterology

## 2013-05-06 ENCOUNTER — Ambulatory Visit (INDEPENDENT_AMBULATORY_CARE_PROVIDER_SITE_OTHER): Payer: Medicare Other | Admitting: Gastroenterology

## 2013-05-06 VITALS — BP 142/62 | HR 58 | Temp 98.6°F | Ht 68.0 in | Wt 195.2 lb

## 2013-05-06 DIAGNOSIS — Z85038 Personal history of other malignant neoplasm of large intestine: Secondary | ICD-10-CM | POA: Insufficient documentation

## 2013-05-06 DIAGNOSIS — K5900 Constipation, unspecified: Secondary | ICD-10-CM

## 2013-05-06 DIAGNOSIS — D649 Anemia, unspecified: Secondary | ICD-10-CM | POA: Diagnosis not present

## 2013-05-06 DIAGNOSIS — B1920 Unspecified viral hepatitis C without hepatic coma: Secondary | ICD-10-CM | POA: Diagnosis not present

## 2013-05-06 DIAGNOSIS — B182 Chronic viral hepatitis C: Secondary | ICD-10-CM | POA: Diagnosis not present

## 2013-05-06 DIAGNOSIS — Z8719 Personal history of other diseases of the digestive system: Secondary | ICD-10-CM

## 2013-05-06 MED ORDER — LINACLOTIDE 145 MCG PO CAPS: 145.0000 ug | ORAL_CAPSULE | Freq: Every day | ORAL | Status: DC

## 2013-05-06 MED ORDER — PEG-KCL-NACL-NASULF-NA ASC-C 100 G PO SOLR: 1.0000 | ORAL | Status: DC

## 2013-05-06 NOTE — Progress Notes (Signed)
CC'd to PCP 

## 2013-05-06 NOTE — Assessment & Plan Note (Signed)
Gives history of hepatitis C, notified by blood bank years ago. He never saw any type of followup. We will check HCV RNA as well as immune status to hepatitis A and B at this time. Notably patient has had normal liver on CT 10/2012 and normal LFTs.

## 2013-05-06 NOTE — Addendum Note (Signed)
Addended by: Tiffany Kocher on: 05/06/2013 02:08 PM   Modules accepted: Orders

## 2013-05-06 NOTE — Progress Notes (Signed)
Primary Care Physician:  Catalina Pizza, MD  Primary Gastroenterologist:  Jonette Eva, MD   Chief Complaint  Patient presents with  . Colonoscopy    HPI:  John Molina is a 72 y.o. male here to schedule surveillance colonoscopy. He had history of colon cancer (Stage III) back around 1999. S/P resection/chemotherapy. Last colonoscopy in 2006 per his report. All done out of state [facility]) and records unavailable as practice no longer exist.  C/o lower abdominal pain for the past 2 months. Specks of blood in urine, worse with exertion. Has appointment with urologist on Monday. No dysuria. Averages 5 Percocet daily for chronic back pain. Had a back surgery earlier this year but continues to have back pain on left side. Lower abdominal pain occurred after back surgery as well. MRI done within last one week as outlined below. No brbpr. No fever. Appetite good. 60 pound weight gain in the last one year since injured back. No dysphagia. No heartburn. Wife sees Dr. Darrick Penna. Discussed conscious sedation versus monitored anesthesia care with patient. He states he did fine with conscious sedation with his last colonoscopy despite chronic narcotic use and prefers to go that route. Notes constipation with narcotic use. Uses a medication his wife gives him (Linzess) which helps.  He states he was told he had hepatitis C years ago when he was donating blood. Never followed up on this.  Current Outpatient Prescriptions  Medication Sig Dispense Refill  . fenofibrate (TRICOR) 145 MG tablet Take 145 mg by mouth daily.      . fluocinolone (SYNALAR) 0.025 % cream Apply 1 application topically 3 (three) times daily as needed (psoriasis).      . Menthol-Camphor (TIGER BALM ARTHRITIS RUB EX) Apply 1 application topically as needed (arm pain).      Marland Kitchen olmesartan-hydrochlorothiazide (BENICAR HCT) 40-25 MG per tablet Take 1 tablet by mouth daily.      Marland Kitchen oxyCODONE (ROXICODONE) 15 MG immediate release tablet Take  15-30 mg by mouth every 6 (six) hours as needed for pain.      . rosuvastatin (CRESTOR) 40 MG tablet Take 40 mg by mouth daily.       No current facility-administered medications for this visit.    Allergies as of 05/06/2013  . (No Known Allergies)    Past Medical History  Diagnosis Date  . Hypertension   . Colon cancer   . Arthritis   . Anemia   . Chronic back pain     pain medication for 20 years  . Depression   . Pneumonia     hx of  . Hepatitis     Hep C, found out while donating blood. No prior work-up.  . Sleep apnea   . Tremor   . Hyperlipidemia   . High triglycerides     Past Surgical History  Procedure Laterality Date  . Colon resection  08/1998  . Hernia repair    . Appendectomy    . Knee surgery Left   . Vasectomy    . Cataract extraction      bilateral  . Shoulder open rotator cuff repair Right 11/07/2012    Procedure: ROTATOR CUFF REPAIR SHOULDER OPEN;  Surgeon: Darreld Mclean, MD;  Location: [facility];  Service: Orthopedics;  Laterality: Right;  Right Open Rotator Cuff Repair  . Shoulder acromioplasty Right 11/07/2012    Procedure: SHOULDER ACROMIOPLASTY;  Surgeon: Darreld Mclean, MD;  Location: [facility];  Service: Orthopedics;  Laterality: Right;  . Portacath placement  and removal  . Lumbar laminectomy/decompression microdiscectomy Bilateral 01/10/2013    Procedure: LUMBAR LAMINECTOMY/DECOMPRESSION MICRODISCECTOMY 2 LEVELS;  Surgeon: Temple Pacini, MD;  Location: [facility];  Service: Neurosurgery;  Laterality: Bilateral;  Bilateral lumbar three-four,Lumbar four-five  . Back surgery      Family History  Problem Relation Age of Onset  . Colon cancer Neg Hx     History   Social History  . Marital Status: Married    Spouse Name: N/A    Number of Children: 2  . Years of Education: N/A   Occupational History  . 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 Smoking status: Never Smoker   . Smokeless tobacco: Never Used   . Alcohol Use: No     Comment: patient quit in 1988. patient drinks 5 cups of coffee weekly  . Drug Use: No  . Sexual Activity: Yes    Birth Control/ Protection: None   Other Topics Concern  . Not on file   Social History Narrative  . No narrative on file      ROS:  General: Negative for anorexia, weight loss, fever, chills, fatigue, weakness. Eyes: Negative for vision changes.  ENT: Negative for hoarseness, difficulty swallowing , nasal congestion. CV: Negative for chest pain, angina, palpitations, dyspnea on exertion, peripheral edema.  Respiratory: Negative for dyspnea at rest, dyspnea on exertion, cough, sputum, wheezing.  GI: See history of present illness. GU:  Negative for dysuria, urinary incontinence, urinary frequency, nocturnal urination. Complains of "specks of blood" in his urine with exertion over the past couple of months. Appointment with urologist on Monday. MS: Chronic back pain.  Derm: Negative for rash or itching.  Neuro: Negative for seizure, frequent headaches, memory loss, confusion. Numbness of both feet. Psych: Negative for anxiety, depression, suicidal ideation, hallucinations.  Endo: gained 60 pounds since back injury one year ago Heme: Negative for bruising or bleeding. Allergy: Negative for rash or hives.    Physical Examination:  BP 142/62  Pulse 58  Temp(Src) 98.6 F (37 C) (Oral)  Ht 5\' 8"  (1.727 m)  Wt 195 lb 3.2 oz (88.542 kg)  BMI 29.69 kg/m2   General: Well-nourished, well-developed in no acute distress.  Head: Normocephalic, atraumatic.   Eyes: Conjunctiva pink, no icterus. Mouth: Oropharyngeal mucosa moist and pink , no lesions erythema or exudate. Neck: Supple without thyromegaly, masses, or lymphadenopathy.  Lungs: Clear to auscultation bilaterally.  Heart: Regular rate and rhythm, no murmurs rubs or gallops.  Abdomen: Bowel sounds are normal, minimal suprapubic tenderness, nondistended, no hepatosplenomegaly or masses, no  abdominal bruits or    hernia , no rebound or guarding.   Rectal: Deferred Extremities: No lower extremity edema. No clubbing or deformities.  Neuro: Alert and oriented x 4 , grossly normal neurologically.  Skin: Warm and dry, no rash or jaundice.   Psych: Alert and cooperative, normal mood and affect.  Labs: Labs from April 2014. Sodium 143, potassium 5, BUN 10, creatinine 0.85, total bilirubin 0.6, alkaline phosphatase 51, AST 23, ALT 26, albumin 4.8, calcium 10.3, white blood cell count 4000, hemoglobin 12.6, hematocrit 36.4, MCV 86.5, platelets 242,000.  Imaging Studies: Mr Lumbar Spine W Wo Contrast  05/06/2013   *RADIOLOGY REPORT*  Clinical Data: Status post L3-4 and L4-5 decompressive laminotomy and medial facetectomies with bilateral decompressive foraminotomies 01/10/2013.  Continued back pain.  BUN and creatinine were obtained on site at [facility] at [address] Results:  BUN  28 mg/dL,  Creatinine 1.6 mg/dL.  MRI LUMBAR SPINE WITHOUT AND WITH CONTRAST  Technique:  Multiplanar and multiecho pulse sequences of the lumbar spine were obtained without and with intravenous contrast.  Contrast: 9mL MULTIHANCE GADOBENATE DIMEGLUMINE 529 MG/ML IV SOLN  Comparison: None.  Findings: Scattered Schmorl's nodes are noted, most prominent in the superior endplate of L2.  No fracture is identified.  Vertebral body alignment and signal are maintained.  The conus medullaris is normal in signal and position. Imaged intra-abdominal contents are unremarkable.  The T11-12 and T12-L1 levels are imaged in the sagittal plane only and negative.  L1-2:  Shallow disc bulge without central canal or foraminal stenosis.  L2-3:  Negative.  L3-4:  Status post decompression.  There is a shallow disc bulge but the thecal sac appears widely patent.  There is some epidural enhancement about the exiting L3 roots and laminotomy site compatible with postoperative change but no nerve root compression is identified.   L4-5:  Status post decompression.  The thecal sac appears patent with a shallow disc bulge identified.  There is some there is some enhancement about the surgical site and exiting L4 roots consistent with postoperative change.  No nerve root compression is identified.  L5-S1:  Mild disc bulge and mild to moderate facet degenerative change.  The central canal is open.  Moderate to moderately severe foraminal narrowing appears worse on the right.  IMPRESSION:  1.  Postoperative change L3-4 and L4-5 as described.  The thecal sac appears patent at each level with epidural enhancement about the operative site and exiting nerve roots identified.  Disc bulging results in some residual foraminal narrowing most notable at L4-5 but no nerve root compression is identified. 2.  Disc bulge and facet arthropathy cause moderate to moderately severe foraminal narrowing at L5-S1, worse on the right.   Original Report Authenticated By: Holley Dexter, M.D.

## 2013-05-06 NOTE — Assessment & Plan Note (Signed)
Due for surveillance colonoscopy at this time. Last colonoscopy reported to be 2006. Patient states he has done well in the past with conscious sedation despite chronic narcotics and request trial of conscious sedation at this time. Will augment with Phenergan 12.5 mg IV 30 minutes before the procedure.  I have discussed the risks, alternatives, benefits with regards to but not limited to the risk of reaction to medication, bleeding, infection, perforation and the patient is agreeable to proceed. Written consent to be obtained.  Noted to have mild normocytic anemia with multiple surgeries this year. Colonoscopy as planned.

## 2013-05-06 NOTE — Progress Notes (Signed)
Please let patient know we sent RX for Linzess for constipation to pharmacy. Same thing his wife is on. He can come by and pick up voucher for free RX if he would like.

## 2013-05-06 NOTE — Assessment & Plan Note (Signed)
Likely narcotic induced. Takes wife's Linzess on occasion with good results. Will provide with prescription.

## 2013-05-06 NOTE — Patient Instructions (Addendum)
1. Please have your blood work done. 2. We have scheduled you for colonoscopy with Dr. Darrick Penna. Please see separate instructions.

## 2013-05-06 NOTE — Progress Notes (Addendum)
TCS WITH CS-PHENERGAN 12.5 MG IV PREOP

## 2013-05-07 LAB — HCV RNA QUANT RFLX ULTRA OR GENOTYP: HCV Quantitative: NOT DETECTED IU/mL (ref <15)

## 2013-05-07 LAB — HEPATITIS A ANTIBODY, TOTAL: Hep A Total Ab: NEGATIVE

## 2013-05-07 NOTE — Progress Notes (Signed)
Called pt and he is aware the prescription for the Linzess was sent to the pharmacy and he will come by tomorrow to get the voucher. It is at the front.

## 2013-05-08 DIAGNOSIS — M47817 Spondylosis without myelopathy or radiculopathy, lumbosacral region: Secondary | ICD-10-CM | POA: Diagnosis not present

## 2013-05-09 ENCOUNTER — Encounter (HOSPITAL_COMMUNITY): Payer: Self-pay

## 2013-05-09 ENCOUNTER — Encounter (HOSPITAL_COMMUNITY)
Admission: RE | Disposition: A | Discharge status: Home / Self Care | Payer: Self-pay | Source: Ambulatory Visit | Attending: Gastroenterology

## 2013-05-09 ENCOUNTER — Ambulatory Visit (HOSPITAL_COMMUNITY)
Admission: RE | Admit: 2013-05-09 | Discharge: 2013-05-09 | Disposition: A | Discharge status: Home / Self Care | Payer: Medicare Other | Source: Ambulatory Visit | Attending: Gastroenterology | Admitting: Gastroenterology

## 2013-05-09 DIAGNOSIS — B1920 Unspecified viral hepatitis C without hepatic coma: Secondary | ICD-10-CM | POA: Diagnosis not present

## 2013-05-09 DIAGNOSIS — R259 Unspecified abnormal involuntary movements: Secondary | ICD-10-CM | POA: Diagnosis not present

## 2013-05-09 DIAGNOSIS — F3289 Other specified depressive episodes: Secondary | ICD-10-CM | POA: Insufficient documentation

## 2013-05-09 DIAGNOSIS — K648 Other hemorrhoids: Secondary | ICD-10-CM | POA: Insufficient documentation

## 2013-05-09 DIAGNOSIS — Z8701 Personal history of pneumonia (recurrent): Secondary | ICD-10-CM | POA: Diagnosis not present

## 2013-05-09 DIAGNOSIS — Z8719 Personal history of other diseases of the digestive system: Secondary | ICD-10-CM

## 2013-05-09 DIAGNOSIS — D649 Anemia, unspecified: Secondary | ICD-10-CM | POA: Diagnosis not present

## 2013-05-09 DIAGNOSIS — D126 Benign neoplasm of colon, unspecified: Secondary | ICD-10-CM | POA: Insufficient documentation

## 2013-05-09 DIAGNOSIS — Z79899 Other long term (current) drug therapy: Secondary | ICD-10-CM | POA: Diagnosis not present

## 2013-05-09 DIAGNOSIS — Z1211 Encounter for screening for malignant neoplasm of colon: Secondary | ICD-10-CM | POA: Insufficient documentation

## 2013-05-09 DIAGNOSIS — E781 Pure hyperglyceridemia: Secondary | ICD-10-CM | POA: Insufficient documentation

## 2013-05-09 DIAGNOSIS — E785 Hyperlipidemia, unspecified: Secondary | ICD-10-CM | POA: Insufficient documentation

## 2013-05-09 DIAGNOSIS — I10 Essential (primary) hypertension: Secondary | ICD-10-CM | POA: Diagnosis not present

## 2013-05-09 DIAGNOSIS — F329 Major depressive disorder, single episode, unspecified: Secondary | ICD-10-CM | POA: Diagnosis not present

## 2013-05-09 DIAGNOSIS — Z9049 Acquired absence of other specified parts of digestive tract: Secondary | ICD-10-CM | POA: Diagnosis not present

## 2013-05-09 DIAGNOSIS — M129 Arthropathy, unspecified: Secondary | ICD-10-CM | POA: Insufficient documentation

## 2013-05-09 DIAGNOSIS — G4730 Sleep apnea, unspecified: Secondary | ICD-10-CM | POA: Insufficient documentation

## 2013-05-09 DIAGNOSIS — Z980 Intestinal bypass and anastomosis status: Secondary | ICD-10-CM | POA: Insufficient documentation

## 2013-05-09 DIAGNOSIS — Z85038 Personal history of other malignant neoplasm of large intestine: Secondary | ICD-10-CM | POA: Diagnosis not present

## 2013-05-09 HISTORY — PX: COLONOSCOPY: SHX5424

## 2013-05-09 SURGERY — COLONOSCOPY
Anesthesia: Moderate Sedation

## 2013-05-09 MED ORDER — MIDAZOLAM HCL 5 MG/5ML IJ SOLN
INTRAMUSCULAR | Status: DC | PRN
  Administered 2013-05-09 (×2): 2 mg via INTRAVENOUS

## 2013-05-09 MED ORDER — MEPERIDINE HCL 100 MG/ML IJ SOLN
INTRAMUSCULAR | Status: AC
  Filled 2013-05-09: qty 2

## 2013-05-09 MED ORDER — SODIUM CHLORIDE 0.9 % IV SOLN
INTRAVENOUS | Status: DC
  Administered 2013-05-09: 10:00:00 via INTRAVENOUS

## 2013-05-09 MED ORDER — PROMETHAZINE HCL 25 MG/ML IJ SOLN
12.5000 mg | Freq: Once | INTRAMUSCULAR | Status: AC
  Administered 2013-05-09: 12.5 mg via INTRAVENOUS

## 2013-05-09 MED ORDER — SODIUM CHLORIDE 0.9 % IJ SOLN
INTRAMUSCULAR | Status: AC
  Filled 2013-05-09: qty 10

## 2013-05-09 MED ORDER — STERILE WATER FOR IRRIGATION IR SOLN
Status: DC | PRN
  Administered 2013-05-09: 12:00:00

## 2013-05-09 MED ORDER — MIDAZOLAM HCL 5 MG/5ML IJ SOLN
INTRAMUSCULAR | Status: AC
  Filled 2013-05-09: qty 10

## 2013-05-09 MED ORDER — PROMETHAZINE HCL 25 MG/ML IJ SOLN
INTRAMUSCULAR | Status: AC
  Filled 2013-05-09: qty 1

## 2013-05-09 MED ORDER — MEPERIDINE HCL 100 MG/ML IJ SOLN
INTRAMUSCULAR | Status: DC | PRN
  Administered 2013-05-09: 50 mg via INTRAVENOUS
  Administered 2013-05-09: 25 mg via INTRAVENOUS

## 2013-05-09 NOTE — H&P (Signed)
Primary Care Physician:  Catalina Pizza, MD Primary Gastroenterologist:  Dr. Darrick Penna  Pre-Procedure History & Physical: HPI:  John Molina is a 72 y.o. male here for  PERSONAL HISTORY OF COLON CANCER.  Past Medical History  Diagnosis Date  . Hypertension   . Colon cancer   . Arthritis   . Anemia   . Chronic back pain     pain medication for 20 years  . Depression   . Pneumonia     hx of  . Hepatitis     Hep C, found out while donating blood. No prior work-up.  . Sleep apnea   . Tremor   . Hyperlipidemia   . High triglycerides     Past Surgical History  Procedure Laterality Date  . Colon resection  08/1998  . Hernia repair    . Appendectomy    . Knee surgery Left   . Vasectomy    . Cataract extraction      bilateral  . Shoulder open rotator cuff repair Right 11/07/2012    Procedure: ROTATOR CUFF REPAIR SHOULDER OPEN;  Surgeon: Darreld Mclean, MD;  Location: [facility];  Service: Orthopedics;  Laterality: Right;  Right Open Rotator Cuff Repair  . Shoulder acromioplasty Right 11/07/2012    Procedure: SHOULDER ACROMIOPLASTY;  Surgeon: Darreld Mclean, MD;  Location: [facility];  Service: Orthopedics;  Laterality: Right;  . Portacath placement      and removal  . Lumbar laminectomy/decompression microdiscectomy Bilateral 01/10/2013    Procedure: LUMBAR LAMINECTOMY/DECOMPRESSION MICRODISCECTOMY 2 LEVELS;  Surgeon: Temple Pacini, MD;  Location: [facility];  Service: Neurosurgery;  Laterality: Bilateral;  Bilateral lumbar three-four,Lumbar four-five  . Back surgery      Prior to Admission medications   Medication Sig Start Date End Date Taking? Authorizing Provider  fenofibrate (TRICOR) 145 MG tablet Take 145 mg by mouth daily.   Yes Historical Provider, MD  fluocinolone (SYNALAR) 0.025 % cream Apply 1 application topically 3 (three) times daily as needed (psoriasis).   Yes Historical Provider, MD  Linaclotide Karlene Einstein) 145 MCG CAPS capsule Take 1 capsule (145 mcg total) by mouth daily  before breakfast. 05/06/13  Yes Tiffany Kocher, PA-C  Menthol-Camphor (TIGER BALM ARTHRITIS RUB EX) Apply 1 application topically as needed (arm pain).   Yes Historical Provider, MD  olmesartan-hydrochlorothiazide (BENICAR HCT) 40-25 MG per tablet Take 1 tablet by mouth daily.   Yes Historical Provider, MD  oxyCODONE (ROXICODONE) 15 MG immediate release tablet Take 15-30 mg by mouth every 6 (six) hours as needed for pain.   Yes Historical Provider, MD  peg 3350 powder (MOVIPREP) 100 G SOLR Take 1 kit (200 g total) by mouth as directed. 05/06/13  Yes West Bali, MD  rosuvastatin (CRESTOR) 40 MG tablet Take 40 mg by mouth daily.   Yes Historical Provider, MD    Allergies as of 05/06/2013  . (No Known Allergies)    Family History  Problem Relation Age of Onset  . Colon cancer Neg Hx     History   Social History  . Marital Status: Married    Spouse Name: N/A    Number of Children: 2  . Years of Education: N/A   Occupational History  . 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 Smoking status: Never Smoker   . Smokeless tobacco: Never Used  . Alcohol Use: No     Comment: patient quit in 1988. patient drinks 5 cups of coffee  weekly  . Drug Use: No  . Sexual Activity: Yes    Birth Control/ Protection: None   Other Topics Concern  . Not on file   Social History Narrative  . No narrative on file    Review of Systems: See HPI, otherwise negative ROS   Physical Exam: BP 125/65  Pulse 51  Temp(Src) 97.8 F (36.6 C) (Oral)  Resp 18  Ht 5\' 8"  (1.727 m)  Wt 190 lb (86.183 kg)  BMI 28.9 kg/m2  SpO2 97% General:   Alert,  pleasant and cooperative in NAD Head:  Normocephalic and atraumatic. Neck:  Supple; Lungs:  Clear throughout to auscultation.    Heart:  Regular rate and rhythm. Abdomen:  Soft, nontender and nondistended. Normal bowel sounds, without guarding, and without rebound.   Neurologic:  Alert and  oriented x4;  grossly normal  neurologically.  Impression/Plan:    PERSONAL HISTORY OF COLON CANCER.  PLAN:  1. TCS TODAY

## 2013-05-09 NOTE — Op Note (Signed)
[facility] [address] [place], [address]   COLONOSCOPY PROCEDURE REPORT  PATIENT: John Molina, John Molina.  MR#: [id number] BIRTHDATE: Jul 02, 1941 , 72  yrs. old GENDER: Male ENDOSCOPIST: Jonette Eva, MD REFERRED DG:UYQI Hall, M.D. PROCEDURE DATE:  05/09/2013 PROCEDURE:   ILEOColonoscopy with snare polypectomy INDICATIONS:High risk patient with personal history of colon cancer-STAGE III 1999 . TOOK MoviPrep & HAD DIFFICULTY WITH 2ND DOSE. MEDICATIONS: Demerol 75 mg IV, Versed, and Versed 4 mg IV  DESCRIPTION OF PROCEDURE:    Physical exam was performed.  Informed consent was obtained from the patient after explaining the benefits, risks, and alternatives to procedure.  The patient was connected to monitor and placed in left lateral position. Continuous oxygen was provided by nasal cannula and IV medicine administered through an indwelling cannula.  After administration of sedation and rectal exam, the patients rectum was intubated and the EC-3890Li ([id number])  colonoscope was advanced under direct visualization to the ileum.  The scope was removed slowly by carefully examining the color, texture, anatomy, and integrity mucosa on the way out.  The patient was recovered in endoscopy and discharged home in satisfactory condition.     COLON FINDINGS: The mucosa appeared normal in the terminal ileum.  , A sessile polyp measuring 1 cm in size was found in the ascending colon.  A polypectomy was performed using snare cautery.  The resection was complete and the polyp tissue was completely retrieved, The colon mucosa was otherwise normal.  , NORMAL ANASTOMOSIS  20 CM FROM THE ANAL VERGE.    MODERATE INTERNAL HEMORRHOIDS.  PREP QUALITY: good.   CECAL W/D TIME: 11 minutes     COMPLICATIONS: None  ENDOSCOPIC IMPRESSION: 1.   1 CM COLON POLYP REMOVED 2.   NORMAL ANASTOMOSIS  20 CM FROM THE ANAL VERGE   RECOMMENDATIONS: FOLLOW A HIGH FIBER DIET.  AVOID ITEMS THAT  CAUSE BLOATING & GAS. BIOPSY RESULTS SHOULD BE BACK IN 7 DAYS. Next colonoscopy in 3 years (WITH PREPOPIK) BECAUSE THE POLYP REMOVED > 1 CM. ALL SISTERS, BROTHERS, CHILDREN, AND PARENTS NEED TO HAVE A COLONOSCOPY STARTING AT THE AGE OF 40 AND EVERY 5 YEARS.       _______________________________ Rosalie DoctorJonette Eva, MD 05/09/2013 12:45 PM

## 2013-05-12 ENCOUNTER — Encounter (HOSPITAL_COMMUNITY): Payer: Self-pay | Admitting: Gastroenterology

## 2013-05-12 ENCOUNTER — Telehealth: Payer: Self-pay | Admitting: Gastroenterology

## 2013-05-12 ENCOUNTER — Other Ambulatory Visit (HOSPITAL_COMMUNITY): Payer: Self-pay | Admitting: Urology

## 2013-05-12 DIAGNOSIS — M545 Low back pain, unspecified: Secondary | ICD-10-CM | POA: Diagnosis not present

## 2013-05-12 DIAGNOSIS — M6281 Muscle weakness (generalized): Secondary | ICD-10-CM | POA: Diagnosis not present

## 2013-05-12 DIAGNOSIS — R262 Difficulty in walking, not elsewhere classified: Secondary | ICD-10-CM | POA: Diagnosis not present

## 2013-05-12 DIAGNOSIS — N400 Enlarged prostate without lower urinary tract symptoms: Secondary | ICD-10-CM

## 2013-05-12 DIAGNOSIS — R972 Elevated prostate specific antigen [PSA]: Secondary | ICD-10-CM | POA: Diagnosis not present

## 2013-05-12 DIAGNOSIS — R319 Hematuria, unspecified: Secondary | ICD-10-CM | POA: Diagnosis not present

## 2013-05-12 NOTE — Telephone Encounter (Signed)
Please call pt. HE had AN advanced adenomas removed THAT WAS > 1 CM.     FOLLOW A HIGH FIBER DIET. AVOID ITEMS THAT CAUSE BLOATING & GAS.   Next colonoscopy in 3 years BECAUSE THE POLYP REMOVED WAS 1 CM. YOUR SISTERS, BROTHERS, CHILDREN, AND PARENTS NEED TO HAVE A COLONOSCOPY STARTING AT THE AGE OF 40 AND EVERY 5 YEARS.

## 2013-05-13 LAB — CBC WITH DIFFERENTIAL/PLATELET
HCT: 36 %
HGB: 12.6 g/dL
WBC: 4

## 2013-05-13 LAB — COMPREHENSIVE METABOLIC PANEL
ALT: 26 U/L (ref 10–40)
Albumin: 4.8
Creat: 0.85
Potassium: 5 mmol/L
Sodium: 143 mmol/L (ref 137–147)
Total Bilirubin: 0.6 mg/dL

## 2013-05-13 NOTE — Telephone Encounter (Signed)
Called and informed pt.  

## 2013-05-13 NOTE — Telephone Encounter (Signed)
Pt on recall list for TCS 3 year.

## 2013-05-13 NOTE — Progress Notes (Signed)
Quick Note:  Please let patient know there is no evidence of Hepatitis A or C. He either had Hepatitis B and his body cleared it OR he has had Hepatitis B vaccination.  Please find out if he had Hep B vaccines. ______

## 2013-05-14 ENCOUNTER — Ambulatory Visit (HOSPITAL_COMMUNITY)
Admission: RE | Admit: 2013-05-14 | Discharge: 2013-05-14 | Disposition: A | Discharge status: Home / Self Care | Payer: Medicare Other | Source: Ambulatory Visit | Attending: Urology | Admitting: Urology

## 2013-05-14 DIAGNOSIS — N400 Enlarged prostate without lower urinary tract symptoms: Secondary | ICD-10-CM | POA: Insufficient documentation

## 2013-05-14 DIAGNOSIS — K7689 Other specified diseases of liver: Secondary | ICD-10-CM | POA: Diagnosis not present

## 2013-05-14 MED ORDER — IOHEXOL 300 MG/ML  SOLN
100.0000 mL | Freq: Once | INTRAMUSCULAR | Status: AC | PRN
  Administered 2013-05-14: 100 mL via INTRAVENOUS

## 2013-05-14 NOTE — Progress Notes (Signed)
Quick Note:  I called and informed pt. He said he has never had the Hep B vaccines. He said he was told once that he had Hep C when he was gong to donate blood and he doesn't understand it. Please advise! ______

## 2013-05-15 DIAGNOSIS — M6281 Muscle weakness (generalized): Secondary | ICD-10-CM | POA: Diagnosis not present

## 2013-05-15 DIAGNOSIS — M545 Low back pain, unspecified: Secondary | ICD-10-CM | POA: Diagnosis not present

## 2013-05-15 DIAGNOSIS — R262 Difficulty in walking, not elsewhere classified: Secondary | ICD-10-CM | POA: Diagnosis not present

## 2013-05-16 ENCOUNTER — Encounter: Payer: Self-pay | Admitting: Gastroenterology

## 2013-05-16 NOTE — Progress Notes (Signed)
Quick Note:  LMOM to call. ______ 

## 2013-05-16 NOTE — Progress Notes (Signed)
Quick Note:  I likely had Hepatitis B in the past and his body cleared it. He has NO evidence of ongoing infection with B OR C. This is great news! ______

## 2013-05-20 DIAGNOSIS — N219 Calculus of lower urinary tract, unspecified: Secondary | ICD-10-CM | POA: Diagnosis not present

## 2013-05-20 DIAGNOSIS — R319 Hematuria, unspecified: Secondary | ICD-10-CM | POA: Diagnosis not present

## 2013-05-20 DIAGNOSIS — N400 Enlarged prostate without lower urinary tract symptoms: Secondary | ICD-10-CM | POA: Diagnosis not present

## 2013-05-21 NOTE — Progress Notes (Signed)
Quick Note:  Called and informed pt. ______ 

## 2013-05-22 DIAGNOSIS — R262 Difficulty in walking, not elsewhere classified: Secondary | ICD-10-CM | POA: Diagnosis not present

## 2013-05-22 DIAGNOSIS — M545 Low back pain, unspecified: Secondary | ICD-10-CM | POA: Diagnosis not present

## 2013-05-22 DIAGNOSIS — M6281 Muscle weakness (generalized): Secondary | ICD-10-CM | POA: Diagnosis not present

## 2013-05-29 DIAGNOSIS — E038 Other specified hypothyroidism: Secondary | ICD-10-CM | POA: Diagnosis not present

## 2013-05-29 DIAGNOSIS — M5126 Other intervertebral disc displacement, lumbar region: Secondary | ICD-10-CM | POA: Diagnosis not present

## 2013-05-29 DIAGNOSIS — K5732 Diverticulitis of large intestine without perforation or abscess without bleeding: Secondary | ICD-10-CM | POA: Diagnosis not present

## 2013-05-29 DIAGNOSIS — E040 Nontoxic diffuse goiter: Secondary | ICD-10-CM | POA: Diagnosis not present

## 2013-05-29 DIAGNOSIS — M129 Arthropathy, unspecified: Secondary | ICD-10-CM | POA: Diagnosis not present

## 2013-06-03 ENCOUNTER — Telehealth: Payer: Self-pay

## 2013-06-03 DIAGNOSIS — M75120 Complete rotator cuff tear or rupture of unspecified shoulder, not specified as traumatic: Secondary | ICD-10-CM | POA: Diagnosis not present

## 2013-06-03 DIAGNOSIS — R1032 Left lower quadrant pain: Secondary | ICD-10-CM | POA: Diagnosis not present

## 2013-06-03 DIAGNOSIS — M25519 Pain in unspecified shoulder: Secondary | ICD-10-CM | POA: Diagnosis not present

## 2013-06-03 NOTE — Telephone Encounter (Signed)
Pt came by the office and was complaining of pain in his left groin area. He has had an inguinal hernia on either side before. He said it is just pain, he does not feel a knot. He said one of his former physicians had mentioned one time that he might have diverticulitis. He is not having any abdominal pain. He said Dr. Margo Aye referred him to urologist and that checked out good. I told him it sounds like it is hernia and recommended he see his PCP for that and see what he recommends. I told him I will let Dr. Darrick Penna know and see if she has any recommendations.

## 2013-06-04 DIAGNOSIS — M47817 Spondylosis without myelopathy or radiculopathy, lumbosacral region: Secondary | ICD-10-CM | POA: Diagnosis not present

## 2013-06-08 NOTE — Telephone Encounter (Signed)
PLEASE CALL PT. HE SHOULD MAY CHECK WITH DR. HALL OR SEE SURGEON TO EVALUATE FOR RECURRENT INGUINAL HERNIA.

## 2013-06-09 NOTE — Telephone Encounter (Signed)
Called and informed pt.  

## 2013-06-19 ENCOUNTER — Encounter (INDEPENDENT_AMBULATORY_CARE_PROVIDER_SITE_OTHER): Payer: Self-pay

## 2013-06-19 ENCOUNTER — Telehealth (INDEPENDENT_AMBULATORY_CARE_PROVIDER_SITE_OTHER): Payer: Self-pay

## 2013-06-19 NOTE — Telephone Encounter (Signed)
Need additional records from pt's urologist prior to his 06/23/13 appt with Dr. Abbey Chatters.  We have rec'd records from [facility].

## 2013-06-23 ENCOUNTER — Ambulatory Visit (INDEPENDENT_AMBULATORY_CARE_PROVIDER_SITE_OTHER): Payer: Self-pay | Admitting: General Surgery

## 2013-07-08 ENCOUNTER — Ambulatory Visit (INDEPENDENT_AMBULATORY_CARE_PROVIDER_SITE_OTHER): Payer: Medicare Other | Admitting: General Surgery

## 2013-07-08 ENCOUNTER — Encounter (INDEPENDENT_AMBULATORY_CARE_PROVIDER_SITE_OTHER): Payer: Self-pay | Admitting: General Surgery

## 2013-07-08 VITALS — BP 132/80 | HR 76 | Temp 98.0°F | Resp 18 | Ht 68.0 in | Wt 199.0 lb

## 2013-07-08 DIAGNOSIS — IMO0002 Reserved for concepts with insufficient information to code with codable children: Secondary | ICD-10-CM

## 2013-07-08 DIAGNOSIS — S76219A Strain of adductor muscle, fascia and tendon of unspecified thigh, initial encounter: Secondary | ICD-10-CM | POA: Insufficient documentation

## 2013-07-08 NOTE — Progress Notes (Signed)
Patient ID: John Molina, male   DOB: 01/02/1941, 73 y.o.   MRN: [id number]  Chief Complaint  Patient presents with  . New Evaluation    Hernia    HPI John Molina is a 72 y.o. male.   HPI  He is referred by Dr. Jordan Likes for left groin pain possible inguinal hernia. He underwent lower back surgery in April of this year. During his recovery phase he was compensating and developed pain in his left groin area which increased to July and August but has slowly gotten better. He's had a history of left and right inguinal hernia repairs in the past. He underwent a CT scan because of hematuria and no hernia was mentioned. He's been sent over here for evaluation.  Past Medical History  Diagnosis Date  . Hypertension   . Colon cancer   . Arthritis   . Anemia   . Chronic back pain     pain medication for 20 years  . Depression   . Pneumonia     hx of  . Hepatitis     Per patient Hep C found out while donating blood. 04/2013: HCV RNA negative, Hep B surface Ab +, Hep B surface Ag-. No prior B vaccine. Likely had Hep B and cleared.  . Sleep apnea   . Tremor   . Hyperlipidemia   . High triglycerides     Past Surgical History  Procedure Laterality Date  . Colon resection  08/1998  . Appendectomy    . Knee surgery Left   . Vasectomy    . Cataract extraction      bilateral  . Shoulder open rotator cuff repair Right 11/07/2012    Procedure: ROTATOR CUFF REPAIR SHOULDER OPEN;  Surgeon: Darreld Mclean, MD;  Location: [facility];  Service: Orthopedics;  Laterality: Right;  Right Open Rotator Cuff Repair  . Shoulder acromioplasty Right 11/07/2012    Procedure: SHOULDER ACROMIOPLASTY;  Surgeon: Darreld Mclean, MD;  Location: [facility];  Service: Orthopedics;  Laterality: Right;  . Portacath placement      and removal  . Lumbar laminectomy/decompression microdiscectomy Bilateral 01/10/2013    Procedure: LUMBAR LAMINECTOMY/DECOMPRESSION MICRODISCECTOMY 2 LEVELS;  Surgeon: Temple Pacini, MD;  [facility]: [facility];  Service: Neurosurgery;  Laterality: Bilateral;  Bilateral lumbar three-four,Lumbar four-five  . Back surgery    . Colonoscopy N/A 05/09/2013    Procedure: COLONOSCOPY;  Surgeon: West Bali, MD;  Location: [facility];  Service: Endoscopy;  Laterality: N/A;  10:45  . Hernia repair Bilateral     Family History  Problem Relation Age of Onset  . Colon cancer Neg Hx     Social History History  Substance Use Topics  . Smoking status: Never Smoker   . Smokeless tobacco: Never Used  . Alcohol Use: No     Comment: patient quit in 1988. patient drinks 5 cups of coffee weekly    No Known Allergies  Current Outpatient Prescriptions  Medication Sig Dispense Refill  . CRESTOR 20 MG tablet       . fenofibrate (TRICOR) 145 MG tablet Take 145 mg by mouth daily.      . fluocinolone (SYNALAR) 0.025 % cream Apply 1 application topically 3 (three) times daily as needed (psoriasis).      . Linaclotide (LINZESS) 145 MCG CAPS capsule Take 1 capsule (145 mcg total) by mouth daily before breakfast.  30 capsule  3  . Menthol-Camphor (TIGER BALM ARTHRITIS RUB EX) Apply 1 application topically  as needed (arm pain).      Marland Kitchen olmesartan-hydrochlorothiazide (BENICAR HCT) 40-25 MG per tablet Take 1 tablet by mouth daily.      Marland Kitchen oxyCODONE (ROXICODONE) 15 MG immediate release tablet Take 15-30 mg by mouth every 6 (six) hours as needed for pain.      Marland Kitchen amphetamine-dextroamphetamine (ADDERALL) 10 MG tablet       . ENDOCET 10-325 MG per tablet       . meloxicam (MOBIC) 15 MG tablet       . metroNIDAZOLE (FLAGYL) 500 MG tablet        No current facility-administered medications for this visit.    Review of Systems Review of Systems  Constitutional: Negative.   Respiratory: Negative.   Cardiovascular: Negative.   Gastrointestinal: Positive for abdominal pain, constipation and abdominal distention.  Genitourinary: Negative for difficulty urinating and testicular pain.  Neurological: Positive for  numbness.  Hematological: Negative.     Blood pressure 132/80, pulse 76, temperature 98 F (36.7 C), resp. rate 18, height 5\' 8"  (1.727 m), weight 199 lb (90.266 kg).  Physical Exam Physical Exam  Constitutional: No distress.  Overweight.  HENT:  Head: Normocephalic and atraumatic.  Abdominal: Soft. He exhibits no distension and no mass. There is no tenderness.  Midline scar with no hernia.  Genitourinary:  Bilateral groin scars. No palpable hernias. No testicular masses.    Data Reviewed Recent CT scan.  Assessment    Left groin pain without clinical or CT evidence of hernia. History is consistent with a left groin strain which is slowly getting better.     Plan    Apply moist heat to the area as needed. Avoid activities that lead to the pain. Return visit when necessary.        Kionna Brier J 07/08/2013, 2:28 PM

## 2013-07-08 NOTE — Patient Instructions (Signed)
Avoid activities that cause pain in the left groin. May use moist heat in the area.

## 2013-07-24 ENCOUNTER — Other Ambulatory Visit: Payer: Self-pay

## 2013-07-24 DIAGNOSIS — M75120 Complete rotator cuff tear or rupture of unspecified shoulder, not specified as traumatic: Secondary | ICD-10-CM | POA: Diagnosis not present

## 2013-07-24 DIAGNOSIS — M25519 Pain in unspecified shoulder: Secondary | ICD-10-CM | POA: Diagnosis not present

## 2013-07-29 DIAGNOSIS — Z79899 Other long term (current) drug therapy: Secondary | ICD-10-CM | POA: Diagnosis not present

## 2013-07-29 DIAGNOSIS — M519 Unspecified thoracic, thoracolumbar and lumbosacral intervertebral disc disorder: Secondary | ICD-10-CM | POA: Diagnosis not present

## 2013-07-29 DIAGNOSIS — M47817 Spondylosis without myelopathy or radiculopathy, lumbosacral region: Secondary | ICD-10-CM | POA: Diagnosis not present

## 2013-07-29 DIAGNOSIS — G609 Hereditary and idiopathic neuropathy, unspecified: Secondary | ICD-10-CM | POA: Diagnosis not present

## 2013-08-08 NOTE — Telephone Encounter (Signed)
2nd request for records from pt's urologist.  Will also need copy of his colonoscopy.  Pt's appt is 08/12/13 with Dr. Abbey Chatters.

## 2013-08-08 NOTE — Telephone Encounter (Signed)
Pt's GI physician is Dr. Darrick Penna in [place] at [phone].

## 2013-08-12 ENCOUNTER — Ambulatory Visit (INDEPENDENT_AMBULATORY_CARE_PROVIDER_SITE_OTHER): Payer: Medicare Other | Admitting: General Surgery

## 2013-08-12 ENCOUNTER — Encounter (INDEPENDENT_AMBULATORY_CARE_PROVIDER_SITE_OTHER): Payer: Self-pay | Admitting: General Surgery

## 2013-08-12 VITALS — BP 126/72 | HR 64 | Temp 98.0°F | Resp 18 | Ht 68.0 in | Wt 199.0 lb

## 2013-08-12 DIAGNOSIS — M6200 Separation of muscle (nontraumatic), unspecified site: Secondary | ICD-10-CM

## 2013-08-12 DIAGNOSIS — M6208 Separation of muscle (nontraumatic), other site: Secondary | ICD-10-CM

## 2013-08-12 NOTE — Progress Notes (Signed)
Patient ID: John Molina, male   DOB: Mar 08, 1941, 72 y.o.   MRN: [id number]  Chief Complaint  Patient presents with  . Establish Care    hernia    HPI John Molina is a 72 y.o. male.   HPI  He is self-referred because when he was starting to do a setting of the staple that went from the lower edge of his sternum to the upper portion of his umbilicus.  This causes him some discomfort once in a while. He was concerned that it might be a hernia. He presents for evaluation of this.  Past Medical History  Diagnosis Date  . Hypertension   . Colon cancer   . Arthritis   . Anemia   . Chronic back pain     pain medication for 20 years  . Depression   . Pneumonia     hx of  . Hepatitis     Per patient Hep C found out while donating blood. 04/2013: HCV RNA negative, Hep B surface Ab +, Hep B surface Ag-. No prior B vaccine. Likely had Hep B and cleared.  . Sleep apnea   . Tremor   . Hyperlipidemia   . High triglycerides     Past Surgical History  Procedure Laterality Date  . Colon resection  08/1998  . Appendectomy    . Knee surgery Left   . Vasectomy    . Cataract extraction      bilateral  . Shoulder open rotator cuff repair Right 11/07/2012    Procedure: ROTATOR CUFF REPAIR SHOULDER OPEN;  Surgeon: John Mclean, MD;  Location: [facility];  Service: Orthopedics;  Laterality: Right;  Right Open Rotator Cuff Repair  . Shoulder acromioplasty Right 11/07/2012    Procedure: SHOULDER ACROMIOPLASTY;  Surgeon: John Mclean, MD;  Location: [facility];  Service: Orthopedics;  Laterality: Right;  . Portacath placement      and removal  . Lumbar laminectomy/decompression microdiscectomy Bilateral 01/10/2013    Procedure: LUMBAR LAMINECTOMY/DECOMPRESSION MICRODISCECTOMY 2 LEVELS;  Surgeon: John Pacini, MD;  Location: [facility];  Service: Neurosurgery;  Laterality: Bilateral;  Bilateral lumbar three-four,Lumbar four-five  . Back surgery    . Colonoscopy N/A 05/09/2013    Procedure:  COLONOSCOPY;  Surgeon: John Bali, MD;  Location: [facility];  Service: Endoscopy;  Laterality: N/A;  10:45  . Hernia repair Bilateral     Family History  Problem Relation Age of Onset  . Colon cancer Neg Hx     Social History History  Substance Use Topics  . Smoking status: Never Smoker   . Smokeless tobacco: Never Used  . Alcohol Use: No     Comment: patient quit in 1988. patient drinks 5 cups of coffee weekly    No Known Allergies  Current Outpatient Prescriptions  Medication Sig Dispense Refill  . amphetamine-dextroamphetamine (ADDERALL) 10 MG tablet       . CRESTOR 20 MG tablet       . fenofibrate (TRICOR) 145 MG tablet Take 145 mg by mouth daily.      . fentaNYL (DURAGESIC - DOSED MCG/HR) 50 MCG/HR       . fluocinolone (SYNALAR) 0.025 % cream Apply 1 application topically 3 (three) times daily as needed (psoriasis).      . Menthol-Camphor (TIGER BALM ARTHRITIS RUB EX) Apply 1 application topically as needed (arm pain).      Marland Kitchen olmesartan-hydrochlorothiazide (BENICAR HCT) 40-25 MG per tablet Take 1 tablet by mouth daily.      Marland Kitchen  ENDOCET 10-325 MG per tablet       . Linaclotide (LINZESS) 145 MCG CAPS capsule Take 1 capsule (145 mcg total) by mouth daily before breakfast.  30 capsule  3  . meloxicam (MOBIC) 15 MG tablet       . metroNIDAZOLE (FLAGYL) 500 MG tablet       . oxyCODONE (ROXICODONE) 15 MG immediate release tablet Take 15-30 mg by mouth every 6 (six) hours as needed for pain.       No current facility-administered medications for this visit.    Review of Systems Review of Systems  Constitutional:       He drinks a lot of sodas.  Gastrointestinal: Positive for abdominal distention.  Musculoskeletal: Positive for back pain.    Blood pressure 126/72, pulse 64, temperature 98 F (36.7 C), resp. rate 18, height 5\' 8"  (1.727 m), weight 199 lb (90.266 kg).  Physical Exam Physical Exam  Constitutional: No distress.  Overweight male.  HENT:  Head:  Normocephalic and atraumatic.  Eyes: No scleral icterus.  Abdominal: Soft. He exhibits no mass. There is no tenderness.  Lower midline scars present. Diastases recti is present with no evidence of abdominal wall hernia.  Neurological: He is alert.  Skin: Skin is warm and dry.  Psychiatric: He has a normal mood and affect. His behavior is normal.    Data Reviewed CT of abdomen and pelvis done earlier this year.  Assessment    Diastasis recti- I explained to him what this was and what potentially causes it. I also told him that surgery is typically not done for this.    Plan    Avoid a lot of core exercises. Try to lose weight. Return visit as needed.        John Molina 08/12/2013, 2:37 PM

## 2013-08-12 NOTE — Patient Instructions (Addendum)
You have a diastasis recti.  Surgery is typically not done for this.  Avoid doing a lot of core exercises.  Stop drinking sodas.

## 2013-08-26 DIAGNOSIS — G609 Hereditary and idiopathic neuropathy, unspecified: Secondary | ICD-10-CM | POA: Diagnosis not present

## 2013-08-26 DIAGNOSIS — G894 Chronic pain syndrome: Secondary | ICD-10-CM | POA: Diagnosis not present

## 2013-08-26 DIAGNOSIS — F3289 Other specified depressive episodes: Secondary | ICD-10-CM | POA: Diagnosis not present

## 2013-08-26 DIAGNOSIS — F329 Major depressive disorder, single episode, unspecified: Secondary | ICD-10-CM | POA: Diagnosis not present

## 2013-08-26 DIAGNOSIS — M47817 Spondylosis without myelopathy or radiculopathy, lumbosacral region: Secondary | ICD-10-CM | POA: Diagnosis not present

## 2013-09-03 DIAGNOSIS — M47817 Spondylosis without myelopathy or radiculopathy, lumbosacral region: Secondary | ICD-10-CM | POA: Diagnosis not present

## 2013-09-04 DIAGNOSIS — M47817 Spondylosis without myelopathy or radiculopathy, lumbosacral region: Secondary | ICD-10-CM | POA: Diagnosis not present

## 2013-09-04 DIAGNOSIS — G609 Hereditary and idiopathic neuropathy, unspecified: Secondary | ICD-10-CM | POA: Diagnosis not present

## 2013-09-04 DIAGNOSIS — F329 Major depressive disorder, single episode, unspecified: Secondary | ICD-10-CM | POA: Diagnosis not present

## 2013-09-04 DIAGNOSIS — G894 Chronic pain syndrome: Secondary | ICD-10-CM | POA: Diagnosis not present

## 2013-09-04 DIAGNOSIS — F3289 Other specified depressive episodes: Secondary | ICD-10-CM | POA: Diagnosis not present

## 2013-09-22 DIAGNOSIS — M47817 Spondylosis without myelopathy or radiculopathy, lumbosacral region: Secondary | ICD-10-CM | POA: Diagnosis not present

## 2013-09-23 DIAGNOSIS — M25519 Pain in unspecified shoulder: Secondary | ICD-10-CM | POA: Diagnosis not present

## 2013-09-23 DIAGNOSIS — F329 Major depressive disorder, single episode, unspecified: Secondary | ICD-10-CM | POA: Diagnosis not present

## 2013-09-23 DIAGNOSIS — F3289 Other specified depressive episodes: Secondary | ICD-10-CM | POA: Diagnosis not present

## 2013-09-23 DIAGNOSIS — M47817 Spondylosis without myelopathy or radiculopathy, lumbosacral region: Secondary | ICD-10-CM | POA: Diagnosis not present

## 2013-09-23 DIAGNOSIS — G894 Chronic pain syndrome: Secondary | ICD-10-CM | POA: Diagnosis not present

## 2013-09-23 DIAGNOSIS — G609 Hereditary and idiopathic neuropathy, unspecified: Secondary | ICD-10-CM | POA: Diagnosis not present

## 2013-09-23 DIAGNOSIS — M75120 Complete rotator cuff tear or rupture of unspecified shoulder, not specified as traumatic: Secondary | ICD-10-CM | POA: Diagnosis not present

## 2013-10-14 DIAGNOSIS — R5381 Other malaise: Secondary | ICD-10-CM | POA: Diagnosis not present

## 2013-10-14 DIAGNOSIS — G4730 Sleep apnea, unspecified: Secondary | ICD-10-CM | POA: Diagnosis not present

## 2013-10-14 DIAGNOSIS — G4700 Insomnia, unspecified: Secondary | ICD-10-CM | POA: Diagnosis not present

## 2013-10-14 DIAGNOSIS — R0602 Shortness of breath: Secondary | ICD-10-CM | POA: Diagnosis not present

## 2013-10-14 DIAGNOSIS — I10 Essential (primary) hypertension: Secondary | ICD-10-CM | POA: Diagnosis not present

## 2013-10-14 DIAGNOSIS — R5383 Other fatigue: Secondary | ICD-10-CM | POA: Diagnosis not present

## 2013-10-16 DIAGNOSIS — I10 Essential (primary) hypertension: Secondary | ICD-10-CM | POA: Diagnosis not present

## 2013-10-16 DIAGNOSIS — M47817 Spondylosis without myelopathy or radiculopathy, lumbosacral region: Secondary | ICD-10-CM | POA: Diagnosis not present

## 2013-10-16 DIAGNOSIS — E669 Obesity, unspecified: Secondary | ICD-10-CM | POA: Diagnosis not present

## 2013-10-20 ENCOUNTER — Other Ambulatory Visit: Payer: Self-pay | Admitting: Neurosurgery

## 2013-10-20 DIAGNOSIS — M47816 Spondylosis without myelopathy or radiculopathy, lumbar region: Secondary | ICD-10-CM

## 2013-10-22 ENCOUNTER — Ambulatory Visit
Admission: RE | Admit: 2013-10-22 | Discharge: 2013-10-22 | Disposition: A | Discharge status: Home / Self Care | Payer: Medicare Other | Source: Ambulatory Visit | Attending: Neurosurgery | Admitting: Neurosurgery

## 2013-10-22 ENCOUNTER — Other Ambulatory Visit (HOSPITAL_COMMUNITY): Payer: Self-pay

## 2013-10-22 VITALS — BP 133/58 | HR 65

## 2013-10-22 DIAGNOSIS — R0602 Shortness of breath: Secondary | ICD-10-CM

## 2013-10-22 DIAGNOSIS — M4310 Spondylolisthesis, site unspecified: Secondary | ICD-10-CM | POA: Diagnosis not present

## 2013-10-22 DIAGNOSIS — M47816 Spondylosis without myelopathy or radiculopathy, lumbar region: Secondary | ICD-10-CM

## 2013-10-22 DIAGNOSIS — M48061 Spinal stenosis, lumbar region without neurogenic claudication: Secondary | ICD-10-CM | POA: Diagnosis not present

## 2013-10-22 DIAGNOSIS — M5126 Other intervertebral disc displacement, lumbar region: Secondary | ICD-10-CM | POA: Diagnosis not present

## 2013-10-22 MED ORDER — DIAZEPAM 5 MG PO TABS
5.0000 mg | ORAL_TABLET | Freq: Once | ORAL | Status: AC
  Administered 2013-10-22: 5 mg via ORAL

## 2013-10-22 MED ORDER — IOHEXOL 180 MG/ML  SOLN
18.0000 mL | Freq: Once | INTRAMUSCULAR | Status: AC | PRN
  Administered 2013-10-22: 18 mL via INTRATHECAL

## 2013-10-22 NOTE — Discharge Instructions (Signed)

## 2013-10-23 ENCOUNTER — Other Ambulatory Visit (HOSPITAL_COMMUNITY): Payer: Self-pay

## 2013-10-23 DIAGNOSIS — R0683 Snoring: Secondary | ICD-10-CM

## 2013-10-30 DIAGNOSIS — M4310 Spondylolisthesis, site unspecified: Secondary | ICD-10-CM | POA: Diagnosis not present

## 2013-10-30 DIAGNOSIS — E669 Obesity, unspecified: Secondary | ICD-10-CM | POA: Diagnosis not present

## 2013-11-03 ENCOUNTER — Inpatient Hospital Stay (HOSPITAL_COMMUNITY): Admission: RE | Admit: 2013-11-03 | Payer: Medicare Other | Source: Ambulatory Visit

## 2013-12-09 NOTE — Progress Notes (Signed)
TCS AUG 2014 TV ADENOMA(1) W/O HGD

## 2013-12-11 DIAGNOSIS — Z6830 Body mass index (BMI) 30.0-30.9, adult: Secondary | ICD-10-CM | POA: Diagnosis not present

## 2013-12-11 DIAGNOSIS — M4310 Spondylolisthesis, site unspecified: Secondary | ICD-10-CM | POA: Diagnosis not present

## 2013-12-22 ENCOUNTER — Other Ambulatory Visit: Payer: Self-pay | Admitting: Neurosurgery

## 2013-12-22 ENCOUNTER — Encounter (HOSPITAL_COMMUNITY): Payer: Self-pay

## 2013-12-23 NOTE — Pre-Procedure Instructions (Signed)
John Molina  12/23/2013   Your procedure is scheduled on:  Monday, April 13th  Report to Admitting at 1015 AM.  Call this number if you have problems the morning of surgery: [phone]   Remember:   Do not eat food or drink liquids after midnight.   Take these medicines the morning of surgery with A SIP OF WATER: oxycodone if needed   Do not wear jewelry.  Do not wear lotions, powders, or perfumes. You may wear deodorant.  Do not shave 48 hours prior to surgery. Men may shave face and neck.  Do not bring valuables to the hospital.  [facility] is not responsible  for any belongings or valuables.               Contacts, dentures or bridgework may not be worn into surgery.  Leave suitcase in the car. After surgery it may be brought to your room.  For patients admitted to the hospital, discharge time is determined by your treatment team.  Please read over the following fact sheets that you were given: Pain Booklet, Coughing and Deep Breathing, Blood Transfusion Information, MRSA Information and Surgical Site Infection Prevention [place] - Preparing for Surgery  Before surgery, you can play an important role.  Because skin is not sterile, your skin needs to be as free of germs as possible.  You can reduce the number of germs on you skin by washing with CHG (chlorahexidine gluconate) soap before surgery.  CHG is an antiseptic cleaner which kills germs and bonds with the skin to continue killing germs even after washing.  Please DO NOT use if you have an allergy to CHG or antibacterial soaps.  If your skin becomes reddened/irritated stop using the CHG and inform your nurse when you arrive at Short Stay.  Do not shave (including legs and underarms) for at least 48 hours prior to the first CHG shower.  You may shave your face.  Please follow these instructions carefully:   1.  Shower with CHG Soap the night before surgery and the morning of Surgery.  2.  If you choose to wash your  hair, wash your hair first as usual with your normal shampoo.  3.  After you shampoo, rinse your hair and body thoroughly to remove the shampoo.  4.  Use CHG as you would any other liquid soap.  You can apply CHG directly to the skin and wash gently with scrungie or a clean washcloth.  5.  Apply the CHG Soap to your body ONLY FROM THE NECK DOWN.  Do not use on open wounds or open sores.  Avoid contact with your eyes, ears, mouth and genitals (private parts).  Wash genitals (private parts) with your normal soap.  6.  Wash thoroughly, paying special attention to the area where your surgery will be performed.  7.  Thoroughly rinse your body with warm water from the neck down.  8.  DO NOT shower/wash with your normal soap after using and rinsing off the CHG Soap.  9.  Pat yourself dry with a clean towel.            10.  Wear clean pajamas.            11.  Place clean sheets on your bed the night of your first shower and do not sleep with pets.  Day of Surgery  Do not apply any lotions/deoderants the morning of surgery.  Please wear clean clothes to the hospital/surgery center.

## 2013-12-24 ENCOUNTER — Encounter (HOSPITAL_COMMUNITY): Payer: Self-pay

## 2013-12-24 ENCOUNTER — Encounter (HOSPITAL_COMMUNITY)
Admission: RE | Admit: 2013-12-24 | Discharge: 2013-12-24 | Disposition: A | Discharge status: Home / Self Care | Payer: Medicare Other | Source: Ambulatory Visit | Attending: Neurosurgery | Admitting: Neurosurgery

## 2013-12-24 DIAGNOSIS — Z01810 Encounter for preprocedural cardiovascular examination: Secondary | ICD-10-CM | POA: Diagnosis not present

## 2013-12-24 DIAGNOSIS — Z01812 Encounter for preprocedural laboratory examination: Secondary | ICD-10-CM | POA: Diagnosis not present

## 2013-12-24 HISTORY — DX: Myoneural disorder, unspecified: G70.9

## 2013-12-24 LAB — CBC WITH DIFFERENTIAL/PLATELET
Basophils Absolute: 0.1 10*3/uL (ref 0.0–0.1)
Basophils Relative: 1 % (ref 0–1)
Eosinophils Absolute: 0.2 10*3/uL (ref 0.0–0.7)
Eosinophils Relative: 6 % — ABNORMAL HIGH (ref 0–5)
HCT: 32.7 % — ABNORMAL LOW (ref 39.0–52.0)
Hemoglobin: 11.2 g/dL — ABNORMAL LOW (ref 13.0–17.0)
LYMPHS ABS: 1.4 10*3/uL (ref 0.7–4.0)
LYMPHS PCT: 39 % (ref 12–46)
MCH: 31.4 pg (ref 26.0–34.0)
MCHC: 34.3 g/dL (ref 30.0–36.0)
MCV: 91.6 fL (ref 78.0–100.0)
Monocytes Absolute: 0.4 10*3/uL (ref 0.1–1.0)
Monocytes Relative: 10 % (ref 3–12)
NEUTROS PCT: 44 % (ref 43–77)
Neutro Abs: 1.5 10*3/uL — ABNORMAL LOW (ref 1.7–7.7)
PLATELETS: 172 10*3/uL (ref 150–400)
RBC: 3.57 MIL/uL — ABNORMAL LOW (ref 4.22–5.81)
RDW: 13.4 % (ref 11.5–15.5)
WBC: 3.5 10*3/uL — AB (ref 4.0–10.5)

## 2013-12-24 LAB — BASIC METABOLIC PANEL
BUN: 29 mg/dL — ABNORMAL HIGH (ref 6–23)
CO2: 23 mEq/L (ref 19–32)
Calcium: 9.7 mg/dL (ref 8.4–10.5)
Chloride: 103 mEq/L (ref 96–112)
Creatinine, Ser: 1.38 mg/dL — ABNORMAL HIGH (ref 0.50–1.35)
GFR calc Af Amer: 58 mL/min — ABNORMAL LOW (ref >90)
GFR, EST NON AFRICAN AMERICAN: 50 mL/min — AB (ref >90)
Glucose, Bld: 105 mg/dL — ABNORMAL HIGH (ref 70–99)
Potassium: 4.9 mEq/L (ref 3.7–5.3)
SODIUM: 142 meq/L (ref 137–147)

## 2013-12-24 LAB — TYPE AND SCREEN
ABO/RH(D): A POS
Antibody Screen: NEGATIVE

## 2013-12-24 LAB — SURGICAL PCR SCREEN
MRSA, PCR: NEGATIVE
Staphylococcus aureus: NEGATIVE

## 2013-12-24 LAB — ABO/RH: ABO/RH(D): A POS

## 2013-12-25 NOTE — Progress Notes (Signed)
Anesthesia chart review: Patient is a 73 year old male scheduled for left L3 for extreme lumbar interbody fusion with bilateral percutaneous screws on 12/29/13 by Dr. Annette Stable.  History includes nonsmoker, hypertension, colon cancer s/p colon resection '99, arthritis, anemia, hyperlipidemia, tremor, obstructive sleep apnea without CPAP use, depression.  Notes and labs from 04/2013 indicate that he likely had hepatitis B in the past, but cleared the virus. PCP is Dr. Delphina Cahill.   EKG on 12/24/13 showed NSR, non-specific ST/T wave changes.  Carotid duplex on 02/27/13 showed: < 50% right carotid bifurcation stenosis. No focal plaque accumulation or stenosis on the left. Antegrade bilateral vertebral artery flow.    Preoperative labs noted.  BUN/Cr 29/1.38.  Glucose 105.  H/H 11.2/32.7.  T&S done. Mild renal insufficiency is new since last April.  He is on ARB therapy.  Will order an ISTAT8 on arrival to ensure he has a stable Cr.  If so, I anticipate that he can proceed as planned with post-operative follow-up of renal function by Dr. Annette Stable.  George Hugh [facility] Short Stay Center/Anesthesiology Phone [phone] 12/25/2013 10:10 AM

## 2013-12-28 MED ORDER — CEFAZOLIN SODIUM-DEXTROSE 2-3 GM-% IV SOLR
2.0000 g | INTRAVENOUS | Status: AC
  Administered 2013-12-29: 2 g via INTRAVENOUS
  Filled 2013-12-28: qty 50

## 2013-12-28 MED ORDER — DEXAMETHASONE SODIUM PHOSPHATE 10 MG/ML IJ SOLN
10.0000 mg | INTRAMUSCULAR | Status: DC
  Filled 2013-12-28: qty 1

## 2013-12-29 ENCOUNTER — Inpatient Hospital Stay (HOSPITAL_COMMUNITY): Payer: Medicare Other

## 2013-12-29 ENCOUNTER — Inpatient Hospital Stay (HOSPITAL_COMMUNITY): Payer: Medicare Other | Admitting: Anesthesiology

## 2013-12-29 ENCOUNTER — Encounter (HOSPITAL_COMMUNITY): Payer: Self-pay | Admitting: Anesthesiology

## 2013-12-29 ENCOUNTER — Inpatient Hospital Stay (HOSPITAL_COMMUNITY)
Admission: RE | Admit: 2013-12-29 | Discharge: 2013-12-30 | DRG: 460 | Disposition: A | Discharge status: Home / Self Care | Payer: Medicare Other | Source: Ambulatory Visit | Attending: Neurosurgery | Admitting: Neurosurgery

## 2013-12-29 ENCOUNTER — Encounter (HOSPITAL_COMMUNITY): Payer: Medicare Other | Admitting: Vascular Surgery

## 2013-12-29 ENCOUNTER — Encounter (HOSPITAL_COMMUNITY)
Admission: RE | Disposition: A | Discharge status: Home / Self Care | Payer: Self-pay | Source: Ambulatory Visit | Attending: Neurosurgery

## 2013-12-29 DIAGNOSIS — M4310 Spondylolisthesis, site unspecified: Principal | ICD-10-CM | POA: Diagnosis present

## 2013-12-29 DIAGNOSIS — E785 Hyperlipidemia, unspecified: Secondary | ICD-10-CM | POA: Diagnosis present

## 2013-12-29 DIAGNOSIS — M5126 Other intervertebral disc displacement, lumbar region: Secondary | ICD-10-CM | POA: Diagnosis not present

## 2013-12-29 DIAGNOSIS — Q762 Congenital spondylolisthesis: Secondary | ICD-10-CM | POA: Diagnosis not present

## 2013-12-29 DIAGNOSIS — Z79899 Other long term (current) drug therapy: Secondary | ICD-10-CM | POA: Diagnosis not present

## 2013-12-29 DIAGNOSIS — Z800 Family history of malignant neoplasm of digestive organs: Secondary | ICD-10-CM | POA: Diagnosis not present

## 2013-12-29 DIAGNOSIS — Z85038 Personal history of other malignant neoplasm of large intestine: Secondary | ICD-10-CM | POA: Diagnosis not present

## 2013-12-29 DIAGNOSIS — M549 Dorsalgia, unspecified: Secondary | ICD-10-CM | POA: Diagnosis not present

## 2013-12-29 DIAGNOSIS — M47817 Spondylosis without myelopathy or radiculopathy, lumbosacral region: Secondary | ICD-10-CM | POA: Diagnosis not present

## 2013-12-29 DIAGNOSIS — G4730 Sleep apnea, unspecified: Secondary | ICD-10-CM | POA: Diagnosis present

## 2013-12-29 DIAGNOSIS — I10 Essential (primary) hypertension: Secondary | ICD-10-CM | POA: Diagnosis present

## 2013-12-29 DIAGNOSIS — D649 Anemia, unspecified: Secondary | ICD-10-CM | POA: Diagnosis not present

## 2013-12-29 DIAGNOSIS — M4316 Spondylolisthesis, lumbar region: Secondary | ICD-10-CM

## 2013-12-29 HISTORY — PX: ANTERIOR LATERAL LUMBAR FUSION WITH PERCUTANEOUS SCREW 1 LEVEL: SHX5553

## 2013-12-29 LAB — POCT I-STAT, CHEM 8
BUN: 33 mg/dL — ABNORMAL HIGH (ref 6–23)
Calcium, Ion: 1.33 mmol/L — ABNORMAL HIGH (ref 1.13–1.30)
Chloride: 107 mEq/L (ref 96–112)
Creatinine, Ser: 1.8 mg/dL — ABNORMAL HIGH (ref 0.50–1.35)
Glucose, Bld: 107 mg/dL — ABNORMAL HIGH (ref 70–99)
HCT: 32 % — ABNORMAL LOW (ref 39.0–52.0)
HEMOGLOBIN: 10.9 g/dL — AB (ref 13.0–17.0)
POTASSIUM: 5.7 meq/L — AB (ref 3.7–5.3)
Sodium: 142 mEq/L (ref 137–147)
TCO2: 25 mmol/L (ref 0–100)

## 2013-12-29 SURGERY — ANTERIOR LATERAL LUMBAR FUSION WITH PERCUTANEOUS SCREW 1 LEVEL
Anesthesia: General | Laterality: Left

## 2013-12-29 MED ORDER — MENTHOL 3 MG MT LOZG: 1.0000 | LOZENGE | OROMUCOSAL | Status: DC | PRN

## 2013-12-29 MED ORDER — NEOSTIGMINE METHYLSULFATE 1 MG/ML IJ SOLN
INTRAMUSCULAR | Status: AC
  Filled 2013-12-29: qty 10

## 2013-12-29 MED ORDER — BUPIVACAINE HCL (PF) 0.25 % IJ SOLN
INTRAMUSCULAR | Status: DC | PRN
  Administered 2013-12-29: 20 mL

## 2013-12-29 MED ORDER — HYDROCHLOROTHIAZIDE 25 MG PO TABS
25.0000 mg | ORAL_TABLET | Freq: Every day | ORAL | Status: DC
  Administered 2013-12-29 – 2013-12-30 (×2): 25 mg via ORAL
  Filled 2013-12-29 (×2): qty 1

## 2013-12-29 MED ORDER — SUCCINYLCHOLINE CHLORIDE 20 MG/ML IJ SOLN
INTRAMUSCULAR | Status: DC | PRN
  Administered 2013-12-29: 100 mg via INTRAVENOUS

## 2013-12-29 MED ORDER — MIDAZOLAM HCL 5 MG/5ML IJ SOLN
INTRAMUSCULAR | Status: DC | PRN
  Administered 2013-12-29: 2 mg via INTRAVENOUS

## 2013-12-29 MED ORDER — SODIUM CHLORIDE 0.9 % IJ SOLN: 3.0000 mL | INTRAMUSCULAR | Status: DC | PRN

## 2013-12-29 MED ORDER — DIAZEPAM 5 MG/ML IJ SOLN
5.0000 mg | Freq: Once | INTRAMUSCULAR | Status: AC
  Administered 2013-12-29: 5 mg via INTRAVENOUS

## 2013-12-29 MED ORDER — HYDROMORPHONE HCL PF 1 MG/ML IJ SOLN
0.2500 mg | INTRAMUSCULAR | Status: DC | PRN
  Administered 2013-12-29 (×3): 0.5 mg via INTRAVENOUS

## 2013-12-29 MED ORDER — POLYETHYLENE GLYCOL 3350 17 G PO PACK
17.0000 g | PACK | Freq: Every day | ORAL | Status: DC | PRN
  Filled 2013-12-29: qty 1

## 2013-12-29 MED ORDER — ATORVASTATIN CALCIUM 40 MG PO TABS
40.0000 mg | ORAL_TABLET | Freq: Every day | ORAL | Status: DC
  Administered 2013-12-29: 40 mg via ORAL
  Filled 2013-12-29 (×2): qty 1

## 2013-12-29 MED ORDER — SUFENTANIL CITRATE 50 MCG/ML IV SOLN
INTRAVENOUS | Status: AC
  Filled 2013-12-29: qty 1

## 2013-12-29 MED ORDER — PROPOFOL 10 MG/ML IV BOLUS
INTRAVENOUS | Status: DC | PRN
  Administered 2013-12-29: 250 mg via INTRAVENOUS

## 2013-12-29 MED ORDER — OXYCODONE-ACETAMINOPHEN 5-325 MG PO TABS: 1.0000 | ORAL_TABLET | ORAL | Status: DC | PRN

## 2013-12-29 MED ORDER — HYDROMORPHONE HCL PF 1 MG/ML IJ SOLN
0.5000 mg | INTRAMUSCULAR | Status: DC | PRN
Start: 2013-12-29 — End: 2013-12-30
  Administered 2013-12-29: 1 mg via INTRAVENOUS
  Filled 2013-12-29: qty 1

## 2013-12-29 MED ORDER — SODIUM CHLORIDE 0.9 % IV SOLN
20.0000 mg | INTRAVENOUS | Status: DC | PRN
  Administered 2013-12-29: 50 ug/min via INTRAVENOUS

## 2013-12-29 MED ORDER — DIAZEPAM 5 MG/ML IJ SOLN
INTRAMUSCULAR | Status: AC
  Filled 2013-12-29: qty 2

## 2013-12-29 MED ORDER — LINACLOTIDE 145 MCG PO CAPS
145.0000 ug | ORAL_CAPSULE | Freq: Every day | ORAL | Status: DC
  Administered 2013-12-30: 145 ug via ORAL
  Filled 2013-12-29 (×2): qty 1

## 2013-12-29 MED ORDER — EPHEDRINE SULFATE 50 MG/ML IJ SOLN
INTRAMUSCULAR | Status: DC | PRN
  Administered 2013-12-29: 20 mg via INTRAVENOUS
  Administered 2013-12-29: 15 mg via INTRAVENOUS

## 2013-12-29 MED ORDER — SODIUM CHLORIDE 0.9 % IR SOLN
Status: DC | PRN
  Administered 2013-12-29: 11:00:00

## 2013-12-29 MED ORDER — HYDROCODONE-ACETAMINOPHEN 5-325 MG PO TABS
ORAL_TABLET | ORAL | Status: AC
  Filled 2013-12-29: qty 2

## 2013-12-29 MED ORDER — ROCURONIUM BROMIDE 50 MG/5ML IV SOLN
INTRAVENOUS | Status: AC
  Filled 2013-12-29: qty 1

## 2013-12-29 MED ORDER — 0.9 % SODIUM CHLORIDE (POUR BTL) OPTIME
TOPICAL | Status: DC | PRN
  Administered 2013-12-29: 1000 mL

## 2013-12-29 MED ORDER — PHENOL 1.4 % MT LIQD: 1.0000 | OROMUCOSAL | Status: DC | PRN

## 2013-12-29 MED ORDER — HYDROMORPHONE HCL PF 1 MG/ML IJ SOLN
INTRAMUSCULAR | Status: AC
  Filled 2013-12-29: qty 1

## 2013-12-29 MED ORDER — BISACODYL 10 MG RE SUPP: 10.0000 mg | Freq: Every day | RECTAL | Status: DC | PRN

## 2013-12-29 MED ORDER — ARTIFICIAL TEARS OP OINT
TOPICAL_OINTMENT | OPHTHALMIC | Status: DC | PRN
  Administered 2013-12-29: 1 via OPHTHALMIC

## 2013-12-29 MED ORDER — METHOCARBAMOL 500 MG PO TABS
ORAL_TABLET | ORAL | Status: AC
  Filled 2013-12-29: qty 1

## 2013-12-29 MED ORDER — ONDANSETRON HCL 4 MG/2ML IJ SOLN
4.0000 mg | Freq: Once | INTRAMUSCULAR | Status: DC | PRN
Start: 2013-12-29 — End: 2013-12-29

## 2013-12-29 MED ORDER — PROPOFOL 10 MG/ML IV BOLUS
INTRAVENOUS | Status: AC
  Filled 2013-12-29: qty 20

## 2013-12-29 MED ORDER — GLYCOPYRROLATE 0.2 MG/ML IJ SOLN
INTRAMUSCULAR | Status: AC
  Filled 2013-12-29: qty 5

## 2013-12-29 MED ORDER — CEFAZOLIN SODIUM 1-5 GM-% IV SOLN
1.0000 g | Freq: Three times a day (TID) | INTRAVENOUS | Status: AC
  Administered 2013-12-29 – 2013-12-30 (×2): 1 g via INTRAVENOUS
  Filled 2013-12-29 (×2): qty 50

## 2013-12-29 MED ORDER — EPHEDRINE SULFATE 50 MG/ML IJ SOLN
INTRAMUSCULAR | Status: AC
  Filled 2013-12-29: qty 1

## 2013-12-29 MED ORDER — SODIUM CHLORIDE 0.9 % IJ SOLN
3.0000 mL | Freq: Two times a day (BID) | INTRAMUSCULAR | Status: DC
  Administered 2013-12-29 (×2): 3 mL via INTRAVENOUS

## 2013-12-29 MED ORDER — SUFENTANIL CITRATE 50 MCG/ML IV SOLN
INTRAVENOUS | Status: DC | PRN
  Administered 2013-12-29: 10 ug via INTRAVENOUS
  Administered 2013-12-29: 5 ug via INTRAVENOUS

## 2013-12-29 MED ORDER — SODIUM CHLORIDE 0.9 % IJ SOLN
INTRAMUSCULAR | Status: AC
  Filled 2013-12-29: qty 10

## 2013-12-29 MED ORDER — MIDAZOLAM HCL 2 MG/2ML IJ SOLN
INTRAMUSCULAR | Status: AC
Start: 2013-12-29 — End: 2013-12-29
  Filled 2013-12-29: qty 2

## 2013-12-29 MED ORDER — HYDROMORPHONE HCL PF 1 MG/ML IJ SOLN
0.2500 mg | INTRAMUSCULAR | Status: AC | PRN
  Administered 2013-12-29 (×8): 0.5 mg via INTRAVENOUS

## 2013-12-29 MED ORDER — FENTANYL CITRATE 0.05 MG/ML IJ SOLN
INTRAMUSCULAR | Status: DC | PRN
  Administered 2013-12-29: 100 ug via INTRAVENOUS

## 2013-12-29 MED ORDER — SENNA 8.6 MG PO TABS
1.0000 | ORAL_TABLET | Freq: Two times a day (BID) | ORAL | Status: DC
  Administered 2013-12-29: 8.6 mg via ORAL
  Filled 2013-12-29 (×3): qty 1

## 2013-12-29 MED ORDER — PHENYLEPHRINE HCL 10 MG/ML IJ SOLN
INTRAMUSCULAR | Status: DC | PRN
  Administered 2013-12-29 (×2): 80 ug via INTRAVENOUS

## 2013-12-29 MED ORDER — ACETAMINOPHEN 325 MG PO TABS: 650.0000 mg | ORAL_TABLET | ORAL | Status: DC | PRN

## 2013-12-29 MED ORDER — OLMESARTAN MEDOXOMIL-HCTZ 40-25 MG PO TABS: 1.0000 | ORAL_TABLET | Freq: Every day | ORAL | Status: DC

## 2013-12-29 MED ORDER — HYDROMORPHONE HCL PF 1 MG/ML IJ SOLN
INTRAMUSCULAR | Status: AC
  Filled 2013-12-29: qty 2

## 2013-12-29 MED ORDER — ACETAMINOPHEN 650 MG RE SUPP: 650.0000 mg | RECTAL | Status: DC | PRN

## 2013-12-29 MED ORDER — LACTATED RINGERS IV SOLN
INTRAVENOUS | Status: DC | PRN
  Administered 2013-12-29 (×2): via INTRAVENOUS

## 2013-12-29 MED ORDER — HYDROCODONE-ACETAMINOPHEN 5-325 MG PO TABS
1.0000 | ORAL_TABLET | ORAL | Status: DC | PRN
  Administered 2013-12-29: 2 via ORAL

## 2013-12-29 MED ORDER — OXYCODONE HCL 5 MG PO TABS
30.0000 mg | ORAL_TABLET | ORAL | Status: DC | PRN
  Administered 2013-12-29 – 2013-12-30 (×4): 30 mg via ORAL
  Filled 2013-12-29 (×4): qty 6

## 2013-12-29 MED ORDER — ONDANSETRON HCL 4 MG/2ML IJ SOLN
INTRAMUSCULAR | Status: AC
  Filled 2013-12-29: qty 2

## 2013-12-29 MED ORDER — METHOCARBAMOL 500 MG PO TABS
500.0000 mg | ORAL_TABLET | Freq: Two times a day (BID) | ORAL | Status: DC
  Administered 2013-12-29 – 2013-12-30 (×3): 500 mg via ORAL
  Filled 2013-12-29 (×3): qty 1

## 2013-12-29 MED ORDER — IRBESARTAN 300 MG PO TABS
300.0000 mg | ORAL_TABLET | Freq: Every day | ORAL | Status: DC
  Administered 2013-12-29 – 2013-12-30 (×2): 300 mg via ORAL
  Filled 2013-12-29 (×2): qty 1

## 2013-12-29 MED ORDER — FLEET ENEMA 7-19 GM/118ML RE ENEM
1.0000 | ENEMA | Freq: Once | RECTAL | Status: AC | PRN
  Filled 2013-12-29: qty 1

## 2013-12-29 MED ORDER — FENTANYL CITRATE 0.05 MG/ML IJ SOLN
INTRAMUSCULAR | Status: AC
  Filled 2013-12-29: qty 5

## 2013-12-29 MED ORDER — CYCLOBENZAPRINE HCL 10 MG PO TABS
10.0000 mg | ORAL_TABLET | Freq: Three times a day (TID) | ORAL | Status: DC | PRN
  Administered 2013-12-29: 10 mg via ORAL
  Filled 2013-12-29: qty 1

## 2013-12-29 MED ORDER — FENOFIBRATE 160 MG PO TABS
160.0000 mg | ORAL_TABLET | Freq: Every day | ORAL | Status: DC
  Administered 2013-12-29 – 2013-12-30 (×2): 160 mg via ORAL
  Filled 2013-12-29 (×2): qty 1

## 2013-12-29 MED ORDER — HYDROMORPHONE HCL PF 1 MG/ML IJ SOLN
0.2500 mg | INTRAMUSCULAR | Status: DC | PRN
  Administered 2013-12-29: 0.5 mg via INTRAVENOUS

## 2013-12-29 MED ORDER — ONDANSETRON HCL 4 MG/2ML IJ SOLN
4.0000 mg | INTRAMUSCULAR | Status: DC | PRN
Start: 2013-12-29 — End: 2013-12-30
  Administered 2013-12-29: 4 mg via INTRAVENOUS
  Filled 2013-12-29: qty 2

## 2013-12-29 MED ORDER — ALBUMIN HUMAN 5 % IV SOLN
INTRAVENOUS | Status: DC | PRN
  Administered 2013-12-29: 11:00:00 via INTRAVENOUS

## 2013-12-29 MED ORDER — LACTATED RINGERS IV SOLN
INTRAVENOUS | Status: DC
  Administered 2013-12-29: 10:00:00 via INTRAVENOUS

## 2013-12-29 MED ORDER — ALUM & MAG HYDROXIDE-SIMETH 200-200-20 MG/5ML PO SUSP: 30.0000 mL | Freq: Four times a day (QID) | ORAL | Status: DC | PRN

## 2013-12-29 MED ORDER — LIDOCAINE HCL (CARDIAC) 20 MG/ML IV SOLN
INTRAVENOUS | Status: DC | PRN
  Administered 2013-12-29: 100 mg via INTRAVENOUS

## 2013-12-29 SURGICAL SUPPLY — 66 items
BAG DECANTER FOR FLEXI CONT (MISCELLANEOUS) ×2 IMPLANT
BENZOIN TINCTURE PRP APPL 2/3 (GAUZE/BANDAGES/DRESSINGS) ×2 IMPLANT
BLADE SURG ROTATE 9660 (MISCELLANEOUS) IMPLANT
BOLT PLATE XLIF 5.5X55 LRG (Bolt) ×4 IMPLANT
BONE MATRIX OSTEOCEL PRO MED (Bone Implant) ×2 IMPLANT
CONT SPEC 4OZ CLIKSEAL STRL BL (MISCELLANEOUS) IMPLANT
COROENT STNADARD 10X22X55 (Orthopedic Implant) ×2 IMPLANT
COVER BACK TABLE 24X17X13 BIG (DRAPES) IMPLANT
DERMABOND ADHESIVE PROPEN (GAUZE/BANDAGES/DRESSINGS) ×3
DERMABOND ADVANCED (GAUZE/BANDAGES/DRESSINGS) ×2
DERMABOND ADVANCED .7 DNX12 (GAUZE/BANDAGES/DRESSINGS) ×2 IMPLANT
DERMABOND ADVANCED .7 DNX6 (GAUZE/BANDAGES/DRESSINGS) ×3 IMPLANT
DRAPE C-ARM 42X72 X-RAY (DRAPES) ×2 IMPLANT
DRAPE C-ARMOR (DRAPES) ×2 IMPLANT
DRAPE LAPAROTOMY 100X72X124 (DRAPES) ×2 IMPLANT
DRAPE POUCH INSTRU U-SHP 10X18 (DRAPES) ×2 IMPLANT
DRAPE SURG 17X23 STRL (DRAPES) ×4 IMPLANT
ELECT REM PT RETURN 9FT ADLT (ELECTROSURGICAL) ×2
ELECTRODE REM PT RTRN 9FT ADLT (ELECTROSURGICAL) ×1 IMPLANT
GAUZE SPONGE 4X4 16PLY XRAY LF (GAUZE/BANDAGES/DRESSINGS) IMPLANT
GLOVE BIOGEL PI IND STRL 7.0 (GLOVE) ×1 IMPLANT
GLOVE BIOGEL PI IND STRL 8.5 (GLOVE) ×2 IMPLANT
GLOVE BIOGEL PI INDICATOR 7.0 (GLOVE) ×1
GLOVE BIOGEL PI INDICATOR 8.5 (GLOVE) ×2
GLOVE ECLIPSE 7.0 STRL STRAW (GLOVE) ×4 IMPLANT
GLOVE ECLIPSE 7.5 STRL STRAW (GLOVE) ×6 IMPLANT
GLOVE ECLIPSE 8.5 STRL (GLOVE) ×4 IMPLANT
GLOVE ECLIPSE 9.0 STRL (GLOVE) ×2 IMPLANT
GLOVE EXAM NITRILE LRG STRL (GLOVE) IMPLANT
GLOVE EXAM NITRILE MD LF STRL (GLOVE) IMPLANT
GLOVE EXAM NITRILE XL STR (GLOVE) IMPLANT
GLOVE EXAM NITRILE XS STR PU (GLOVE) IMPLANT
GOWN BRE IMP SLV AUR LG STRL (GOWN DISPOSABLE) IMPLANT
GOWN BRE IMP SLV AUR XL STRL (GOWN DISPOSABLE) IMPLANT
GOWN STRL REIN 2XL LVL4 (GOWN DISPOSABLE) IMPLANT
GOWN STRL REUS W/ TWL XL LVL3 (GOWN DISPOSABLE) ×1 IMPLANT
GOWN STRL REUS W/TWL 2XL LVL3 (GOWN DISPOSABLE) ×4 IMPLANT
GOWN STRL REUS W/TWL XL LVL3 (GOWN DISPOSABLE) ×1
GUIDEWIRE NITINOL BEVEL TIP (WIRE) ×4 IMPLANT
KIT BASIN OR (CUSTOM PROCEDURE TRAY) ×2 IMPLANT
KIT DILATOR XLIF 5 (KITS) ×1 IMPLANT
KIT MAXCESS (KITS) ×2 IMPLANT
KIT NEEDLE NVM5 EMG ELECT (KITS) ×1 IMPLANT
KIT NEEDLE NVM5 EMG ELECTRODE (KITS) ×1
KIT ROOM TURNOVER OR (KITS) ×2 IMPLANT
KIT XLIF (KITS) ×1
NEEDLE HYPO 22GX1.5 SAFETY (NEEDLE) ×2 IMPLANT
NEEDLE I-PASS III (NEEDLE) ×2 IMPLANT
NS IRRIG 1000ML POUR BTL (IV SOLUTION) ×2 IMPLANT
PACK LAMINECTOMY NEURO (CUSTOM PROCEDURE TRAY) ×2 IMPLANT
PLATE 2H 10MM (Plate) ×2 IMPLANT
PUTTY BONE DBX 5CC MIX (Putty) ×2 IMPLANT
ROD 45MM (Rod) ×2 IMPLANT
SCREW PRECEPT 6.5X40 (Screw) ×4 IMPLANT
SCREW PRECEPT SET (Screw) ×4 IMPLANT
SPONGE LAP 4X18 X RAY DECT (DISPOSABLE) IMPLANT
STRIP CLOSURE SKIN 1/2X4 (GAUZE/BANDAGES/DRESSINGS) ×2 IMPLANT
SUT VIC AB 2-0 CT1 18 (SUTURE) ×4 IMPLANT
SUT VIC AB 3-0 SH 8-18 (SUTURE) ×4 IMPLANT
SYR 20ML ECCENTRIC (SYRINGE) ×2 IMPLANT
TAPE CLOTH 3X10 TAN LF (GAUZE/BANDAGES/DRESSINGS) ×6 IMPLANT
TOWEL OR 17X24 6PK STRL BLUE (TOWEL DISPOSABLE) ×2 IMPLANT
TOWEL OR 17X26 10 PK STRL BLUE (TOWEL DISPOSABLE) ×2 IMPLANT
TRAY FOLEY CATH 14FRSI W/METER (CATHETERS) ×2 IMPLANT
TRAY FOLEY CATH 16FRSI W/METER (SET/KITS/TRAYS/PACK) ×2 IMPLANT
WATER STERILE IRR 1000ML POUR (IV SOLUTION) ×2 IMPLANT

## 2013-12-29 NOTE — Anesthesia Postprocedure Evaluation (Signed)
  Anesthesia Post-op Note  Patient: John Molina  Procedure(s) Performed: Procedure(s): LUMBAR THREE TO LUMBAR FOUR ANTERIOR LATERAL LUMBAR FUSION WITH PERCUTANEOUS SCREW 1 LEVEL (Left)  Patient Location: PACU  Anesthesia Type:General  Level of Consciousness: awake, alert , oriented and patient cooperative  Airway and Oxygen Therapy: Patient Spontanous Breathing  Post-op Pain: moderate  Post-op Assessment: Post-op Vital signs reviewed, Patient's Cardiovascular Status Stable, Respiratory Function Stable, Patent Airway, No signs of Nausea or vomiting and Pain level controlled  Post-op Vital Signs: stable  Last Vitals:  Filed Vitals:   12/29/13 1258  BP:   Pulse: 104  Temp:   Resp: 23    Complications: No apparent anesthesia complications

## 2013-12-29 NOTE — Op Note (Signed)
Date of procedure: 12/29/2013  Date of dictation: Same  Service: Neurosurgery  Preoperative diagnosis: L3-4 tramatic/postlaminectomy grade 1 lytic spondylolisthesis  Postoperative diagnosis: Same  Procedure Name: Left L3-4 anterior lateral retroperitoneal trans-psoas discectomy and interbody fusion utilizing interbody peek cage, morselized allograft, osteo- cell plus and lateral plate instrumentation  Left L3-4 percutaneous pedicle fixation  Surgeon:Lonn Im A.Andretta Ergle, M.D.  Asst. Surgeon: Ellene Route  Anesthesia: General  Indication: 73 year old male status post previous L3-4 and L4-5 decompressive surgery. Patient initially with very good results but approximately 6 weeks had development of severe back pain and radiation into his legs. Workup demonstrates evidence of spondylolysis at L3 with some element of instability and early spondylolisthesis. Patient presents now for decompression and fusion in hopes of improving his symptoms.  Operative note: After induction of anesthesia, patient positioned in the right lateral decubitus position and upper fully padded. Patient was positioned with the bed flexed. AP and lateral fluoroscopy is used and intraoperative neural monitoring was also used. Incision made overlying L3-4 disc space in the left flank and a more posterior incision was also made in order to gain access into the retroperitoneal space. Starting first in the more posterior incision blunt dissection was carried through into the retroperitoneal space. Psoas muscle and transverse process of L3 and L4 were then applied. The more lateral incision was then opened further. A dilator was then passed through the psoas and docked into the disc space at L3-4 under fluoroscopic guidance. Neural monitoring was used to rule out any adjacent lumbar plexus nerves. A guidewire was placed. The dilator was serially expanded again with constant neural monitoring. Self-retaining retractor was placed and secured.  The retractor was opened. The dilators were removed. The disc space was then directly stimulated with I nerve stimulator and no evidence of adjacent nerve structures were encountered. A small amount of psoas muscle was dissected free and coagulated. A shim was then placed into the L3-4 disc space. The retractor was then opened more. Disc spaces then incised with 15 blade in a rectangular fashion. An aggressive discectomy was then performed using pituitary rongeurs and various curettes. A contralateral Lee's was performed using Cobb elevators. The disc space itself was then subsequently dilated to 10 mm. A 10 mm regular implant was found to be most appropriate. A 10 x 26 x 55 mm cage was then opened and packed with osteo- cell plus and demineralized bone matrix. This was then impacted in place with guides to prevent graft extravasation. The cage was impacted into place and found to be well position both AP and lateral views. The cage guides were removed. The cage was found to be well position once again by fluoroscopy. A NUvasive lateral plate was then placed over L3 and L4. This was then sized to the appropriate size and bicortical screws were then placed by first using an awl to make a guide channel and then 6.5 mm bicortical screws were placed into both L3 and L4. Screws given a final tightening and then locking mechanisms were engaged. The plate was then also locked in place area and retractor was removed. Final images revealed good position the bone graft and hardware and instrumentation and with proper alignment of the spine. Patient is bed was then returned to a neutral position. Patient's lumbar region was then exposed. Stab incisions were made overlying the L3 and L4 pedicles on the left side. A Jamshidi needle was then introduced into the lateral pedicle of L3 and L4 under fluoroscopic guidance. The Jamshidi needle was  then passed through the pedicle into the vertebral bodies of L3 and L4 under fluoroscopic  guidance and with intraoperative nerve root monitoring. Guidewires were left in place and the Jamshidi needles were removed. Each pedicle was then tapped with a 5.5 mm screw tap again under constant neural monitoring. Each screw tap hole was found to be well position both AP and lateral views. 6.5 mm x 40 mm pedicle screws were placed into L3 and L4. A 45 mm rod was then passed through the screw heads at L3 and L4. Locking caps and placed over the screw heads. Locking caps and engaged with the construct under slight compression. Final images revealed good position the bone graft and hardware at both the AP and lateral planes. Wound. MI solution. Marcaine was infiltrated for anesthesia and wounds were closed in a typical fashion. There were no apparent complications. Patient tolerated the procedure well and he returns to the recovery room postop.

## 2013-12-29 NOTE — Anesthesia Preprocedure Evaluation (Addendum)
Anesthesia Evaluation  Patient identified by MRN, date of birth, ID band Patient awake    Reviewed: Allergy & Precautions, H&P , NPO status , Patient's Chart, lab work & pertinent test results  Airway       Dental  (+) Dental Advidsory Given, Edentulous Upper, Upper Dentures   Pulmonary sleep apnea ,          Cardiovascular hypertension,     Neuro/Psych PSYCHIATRIC DISORDERS Depression  Neuromuscular disease    GI/Hepatic   Endo/Other    Renal/GU      Musculoskeletal   Abdominal   Peds  Hematology  (+) anemia ,   Anesthesia Other Findings Patient states that he has no history of Hepatitis C  Reproductive/Obstetrics                        Anesthesia Physical Anesthesia Plan  ASA: II  Anesthesia Plan: General   Post-op Pain Management:    Induction: Intravenous  Airway Management Planned: Oral ETT  Additional Equipment:   Intra-op Plan:   Post-operative Plan: Extubation in OR  Informed Consent: I have reviewed the patients History and Physical, chart, labs and discussed the procedure including the risks, benefits and alternatives for the proposed anesthesia with the patient or authorized representative who has indicated his/her understanding and acceptance.   Dental Advisory Given  Plan Discussed with: Anesthesiologist, CRNA and Surgeon  Anesthesia Plan Comments:        Anesthesia Quick Evaluation

## 2013-12-29 NOTE — Anesthesia Procedure Notes (Signed)
Procedure Name: Intubation Date/Time: 12/29/2013 10:15 AM Performed by: Neldon Newport Pre-anesthesia Checklist: Patient identified, Timeout performed, Emergency Drugs available, Suction available and Patient being monitored Patient Re-evaluated:Patient Re-evaluated prior to inductionOxygen Delivery Method: Circle system utilized Preoxygenation: Pre-oxygenation with 100% oxygen Intubation Type: IV induction Ventilation: Mask ventilation without difficulty Laryngoscope Size: Mac and 3 Grade View: Grade I Tube type: Oral Tube size: 7.5 mm Number of attempts: 1 Placement Confirmation: positive ETCO2,  ETT inserted through vocal cords under direct vision and breath sounds checked- equal and bilateral Secured at: 23 cm Tube secured with: Tape Dental Injury: Teeth and Oropharynx as per pre-operative assessment

## 2013-12-29 NOTE — Brief Op Note (Signed)
12/29/2013  12:24 PM  PATIENT:  John Molina  73 y.o. male  PRE-OPERATIVE DIAGNOSIS:  spondylolisthesis  POST-OPERATIVE DIAGNOSIS:  spondylolisthesis  PROCEDURE:  Procedure(s): LUMBAR THREE TO LUMBAR FOUR ANTERIOR LATERAL LUMBAR FUSION WITH PERCUTANEOUS SCREW 1 LEVEL (Left)  SURGEON:  Surgeon(s) and Role:    * Charlie Pitter, MD - Primary    * Kristeen Miss, MD - Assisting  PHYSICIAN ASSISTANT:   ASSISTANTS:    ANESTHESIA:   general  EBL:  Total I/O In: 1250 [I.V.:1000; IV Piggyback:250] Out: 475 [Urine:425; Blood:50]  BLOOD ADMINISTERED:none  DRAINS: none   LOCAL MEDICATIONS USED:  MARCAINE     SPECIMEN:   DISPOSITION OF SPECIMEN:  N/A  COUNTS:  YES  TOURNIQUET:  * No tourniquets in log *  DICTATION: .Dragon Dictation  PLAN OF CARE: Admit to inpatient   PATIENT DISPOSITION:  PACU - hemodynamically stable.   Delay start of Pharmacological VTE agent (>24hrs) due to surgical blood loss or risk of bleeding: yes

## 2013-12-29 NOTE — Transfer of Care (Signed)
Immediate Anesthesia Transfer of Care Note  Patient: John Molina  Procedure(s) Performed: Procedure(s): LUMBAR THREE TO LUMBAR FOUR ANTERIOR LATERAL LUMBAR FUSION WITH PERCUTANEOUS SCREW 1 LEVEL (Left)  Patient Location: PACU  Anesthesia Type:General  Level of Consciousness: awake, alert  and oriented  Airway & Oxygen Therapy: Patient Spontanous Breathing and Patient connected to nasal cannula oxygen  Post-op Assessment: Report given to PACU RN, Post -op Vital signs reviewed and stable and Patient moving all extremities X 4  Post vital signs: Reviewed and stable  Complications: No apparent anesthesia complications

## 2013-12-29 NOTE — Progress Notes (Signed)
Pt. Continued to state pain is a 10, Dr Tamala Julian stated to take pt to room, after arrival to unit, pt able to ambulate from stretcher to bed and states sitting up is better.

## 2013-12-29 NOTE — H&P (Signed)
John Molina is an 73 y.o. male.   Chief Complaint: Back pain HPI: 73 year old male status post previous L3-4 L4-5 decompressive surgery with good result presents now with severe back pain with radiation into his lower extremities failing conservative management. Workup demonstrates evidence of traumatic spondylolysis at L3 with early L3-4 spondylolisthesis. Patient's failed conservative management and presents now for L3-4 fusion  Past Medical History  Diagnosis Date  . Hypertension   . Colon cancer   . Arthritis   . Anemia   . Chronic back pain     pain medication for 20 years  . Depression   . Tremor   . Hyperlipidemia   . High triglycerides   . Sleep apnea     does not use CPAP  . Neuromuscular disorder     tremors in both arms and legs    Past Surgical History  Procedure Laterality Date  . Colon resection  08/1998  . Appendectomy    . Knee surgery Left   . Vasectomy    . Cataract extraction      bilateral  . Shoulder open rotator cuff repair Right 11/07/2012    Procedure: ROTATOR CUFF REPAIR SHOULDER OPEN;  Surgeon: Sanjuana Kava, MD;  Location: [facility];  Service: Orthopedics;  Laterality: Right;  Right Open Rotator Cuff Repair  . Shoulder acromioplasty Right 11/07/2012    Procedure: SHOULDER ACROMIOPLASTY;  Surgeon: Sanjuana Kava, MD;  Location: [facility];  Service: Orthopedics;  Laterality: Right;  . Portacath placement      and removal  . Lumbar laminectomy/decompression microdiscectomy Bilateral 01/10/2013    Procedure: LUMBAR LAMINECTOMY/DECOMPRESSION MICRODISCECTOMY 2 LEVELS;  Surgeon: Charlie Pitter, MD;  Location: [place];  Service: Neurosurgery;  Laterality: Bilateral;  Bilateral lumbar three-four,Lumbar four-five  . Back surgery    . Colonoscopy N/A 05/09/2013    Procedure: COLONOSCOPY;  Surgeon: Danie Binder, MD;  Location: [facility];  Service: Endoscopy;  Laterality: N/A;  10:45  . Hernia repair Bilateral     Family History  Problem Relation Age of  Onset  . Colon cancer Neg Hx    Social History:  reports that he has never smoked. He has never used smokeless tobacco. He reports that he does not drink alcohol or use illicit drugs.  Allergies:  Allergies  Allergen Reactions  . Morphine And Related     Pt. States that it does not work for him    Medications Prior to Admission  Medication Sig Dispense Refill  . CRESTOR 20 MG tablet Take 20 mg by mouth daily.       . fenofibrate (TRICOR) 145 MG tablet Take 145 mg by mouth daily.      . Linaclotide (LINZESS) 145 MCG CAPS capsule Take 1 capsule (145 mcg total) by mouth daily before breakfast.  30 capsule  3  . olmesartan-hydrochlorothiazide (BENICAR HCT) 40-25 MG per tablet Take 1 tablet by mouth daily.      Marland Kitchen oxyCODONE (ROXICODONE) 15 MG immediate release tablet Take 30 mg by mouth every 6 (six) hours.       . methocarbamol (ROBAXIN) 500 MG tablet Take 500 mg by mouth 2 (two) times daily.        Results for orders placed during the hospital encounter of 12/29/13 (from the past 48 hour(s))  POCT I-STAT, CHEM 8     Status: Abnormal   Collection Time    12/29/13  9:22 AM      Result Value Ref Range   Sodium  142  137 - 147 mEq/L   Potassium 5.7 (*) 3.7 - 5.3 mEq/L   Chloride 107  96 - 112 mEq/L   BUN 33 (*) 6 - 23 mg/dL   Creatinine, Ser 1.80 (*) 0.50 - 1.35 mg/dL   Glucose, Bld 107 (*) 70 - 99 mg/dL   Calcium, Ion 1.33 (*) 1.13 - 1.30 mmol/L   TCO2 25  0 - 100 mmol/L   Hemoglobin 10.9 (*) 13.0 - 17.0 g/dL   HCT 32.0 (*) 39.0 - 52.0 %   No results found.  Review of Systems  Constitutional: Negative.   HENT: Negative.   Eyes: Negative.   Respiratory: Negative.   Cardiovascular: Negative.   Gastrointestinal: Negative.   Genitourinary: Negative.   Musculoskeletal: Negative.   Skin: Negative.   Neurological: Negative.   Endo/Heme/Allergies: Negative.   Psychiatric/Behavioral: Negative.     Blood pressure 154/69, pulse 69, temperature 97.9 F (36.6 C), temperature source  Oral, resp. rate 20, height 5\' 8"  (1.727 m), weight 92.534 kg (204 lb), SpO2 97.00%. Physical Exam  Constitutional: He is oriented to person, place, and time. He appears well-developed and well-nourished. No distress.  HENT:  Head: Normocephalic and atraumatic.  Right Ear: External ear normal.  Left Ear: External ear normal.  Nose: Nose normal.  Mouth/Throat: Oropharynx is clear and moist. No oropharyngeal exudate.  Eyes: Conjunctivae and EOM are normal. Pupils are equal, round, and reactive to light. Right eye exhibits no discharge. Left eye exhibits no discharge.  Neck: Normal range of motion. Neck supple. No tracheal deviation present. No thyromegaly present.  Respiratory: Effort normal and breath sounds normal. No respiratory distress. He has no wheezes.  GI: Soft. Bowel sounds are normal. He exhibits no distension. There is no tenderness.  Neurological: He is alert and oriented to person, place, and time. He has normal reflexes. He displays normal reflexes. No cranial nerve deficit. He exhibits normal muscle tone. Coordination normal.  Skin: Skin is warm and dry. No rash noted. He is not diaphoretic. No erythema. No pallor.  Psychiatric: He has a normal mood and affect. His behavior is normal. Judgment and thought content normal.     Assessment/Plan L3-4 grade 1 lytic spondylolisthesis. Plan left L3-4 anterior lateral retroperitoneal interbody decompression and fusion with peek cage and allograft. This will be supplemented with left-sided L3-4 posterior nonsegmental percutaneous pedicle fixation. Risks and benefits been explained. Patient wishes to proceed. John Molina A John Molina 12/29/2013, 10:05 AM

## 2013-12-29 NOTE — Progress Notes (Signed)
Dr. Tamala Julian aware pt. Still in pain, orders noted, also Dr Annette Stable at bedside and Valium order recieved

## 2013-12-29 NOTE — Plan of Care (Signed)
Problem: Consults Goal: Diagnosis - Spinal Surgery Outcome: Completed/Met Date Met:  12/29/13 [facility]aco/Lumbar Spine Fusion

## 2013-12-30 MED ORDER — OXYCODONE HCL 15 MG PO TABS: 15.0000 mg | ORAL_TABLET | Freq: Four times a day (QID) | ORAL | Status: DC

## 2013-12-30 NOTE — Discharge Summary (Signed)
Physician Discharge Summary  Patient ID: John Molina MRN: [id number] DOB/AGE: 1941-07-03 73 y.o.  Admit date: 12/29/2013 Discharge date: 12/30/2013  Admission Diagnoses:  Discharge Diagnoses:  Principal Problem:   Spondylolisthesis at L3-L4 level   Discharged Condition: good  Hospital Course: The patient was admitted to the hospital where he underwent uncomplicated L3 for decompression and fusion. Postoperatively he is doing well. Preoperative back and leg pain are improved. Patient up ambulating without difficulty and ready for discharge home.  Consults:   Significant Diagnostic Studies:   Treatments:   Discharge Exam: Blood pressure 143/72, pulse 85, temperature 98.8 F (37.1 C), temperature source Oral, resp. rate 20, height 5\' 8"  (1.727 m), weight 92.534 kg (204 lb), SpO2 93.00%. Awake and alert. Oriented and appropriate. Motor and sensory function intact. Wound clean and dry. Chest and abdomen benign.  Disposition: 01-Home or Self Care     Medication List         BENICAR HCT 40-25 MG per tablet  Generic drug:  olmesartan-hydrochlorothiazide  Take 1 tablet by mouth daily.     CRESTOR 20 MG tablet  Generic drug:  rosuvastatin  Take 20 mg by mouth daily.     fenofibrate 145 MG tablet  Commonly known as:  TRICOR  Take 145 mg by mouth daily.     Linaclotide 145 MCG Caps capsule  Commonly known as:  LINZESS  Take 1 capsule (145 mcg total) by mouth daily before breakfast.     methocarbamol 500 MG tablet  Commonly known as:  ROBAXIN  Take 500 mg by mouth 2 (two) times daily.     oxyCODONE 15 MG immediate release tablet  Commonly known as:  ROXICODONE  Take 1-2 tablets (15-30 mg total) by mouth every 6 (six) hours.         Signed: [name] 12/30/2013, 9:27 AM

## 2013-12-30 NOTE — Discharge Instructions (Signed)

## 2013-12-30 NOTE — Progress Notes (Signed)
PT Cancellation Note  Patient Details Name: John Molina MRN: [id number] DOB: 01-Jan-1941   Cancelled Treatment:    Reason Eval/Treat Not Completed: OT screened, no needs identified, will sign off.  OT screened for PT needs.  No needs identified.  PT to sign off.  See OT note for details on education.   Thanks,    Barbarann Ehlers. [place], [place], DPT [phone]   12/30/2013, 9:46 AM

## 2013-12-30 NOTE — Evaluation (Signed)
Occupational Therapy Evaluation Patient Details Name: John Molina MRN: [id number] DOB: [birth year] Today's Date: 12/30/2013    History of Present Illness 73 y.o. s/p LUMBAR THREE TO LUMBAR FOUR ANTERIOR LATERAL LUMBAR FUSION WITH PERCUTANEOUS SCREW 1 LEVEL (Left)   Clinical Impression   Pt s/p above procedure. Education provided and feel pt is safe to d/c home, from OT standpoint, with wife available to assist.     Follow Up Recommendations  No OT follow up;Supervision - Intermittent    Equipment Recommendations  None recommended by OT (pt to get shower chair)   Recommendations for Other Services       Precautions / Restrictions Precautions Precautions: Back;Fall Precaution Booklet Issued: Yes (comment) Precaution Comments: Reviewed precautions with pt Required Braces or Orthoses: Spinal Brace Restrictions Weight Bearing Restrictions: No      Mobility Bed Mobility Overal bed mobility: Modified Independent                Transfers Overall transfer level: Needs assistance Equipment used: None Transfers: Sit to/from Stand Sit to Stand: Supervision;Modified independent (Device/Increase time)         General transfer comment: reinforced to try to keep back straight.    Balance                                            ADL Overall ADL's : Needs assistance/impaired             Lower Body Bathing: Sit to/from stand;With adaptive equipment;Minimal assistance       Lower Body Dressing: Sit to/from stand;Minimal assistance;With adaptive equipment   Toilet Transfer: Supervision/safety;Ambulation;Regular Toilet;Grab bars       Tub/ Shower Transfer: Min guard;Ambulation   Functional mobility during ADLs: Supervision/safety (Min guard for tub transfer) General ADL Comments: Educated on AE for LB ADLs. Pt practiced with sockaid. Pt appreciative of information provided in session-pt states wife will help with dressing, but pt may go  purchase kit. Pt states he has had difficulty with toilet hygiene prior to surgery-educated on toilet aid. Recommended sitting for dressing and sitting on chair for bathing. Recommended spouse be with him for tub transfer. Educated on safe shoewear and discussed rugs in house. Recommended wife pick up rugs and discussed having non skid in bathroom. Educated on use of cup for teeth care and placement of grooming items to avoid breaking precautions.     Vision                     Perception     Praxis      Pertinent Vitals/Pain Pain 8/10. Nurse notified.     Hand Dominance Right   Extremity/Trunk Assessment Upper Extremity Assessment Upper Extremity Assessment: Overall WFL for tasks assessed   Lower Extremity Assessment Lower Extremity Assessment: Overall WFL for tasks assessed       Communication Communication Communication: No difficulties   Cognition Arousal/Alertness: Awake/alert Behavior During Therapy: WFL for tasks assessed/performed Overall Cognitive Status: Within Functional Limits for tasks assessed                     General Comments       Exercises       Shoulder Instructions      Home Living Family/patient expects to be discharged to:: Private residence Living Arrangements: Spouse/significant other Available Help at Discharge: Family;Available 24 hours/day Type  of Home: House Home Access: Stairs to enter [employer] of Steps: 3 Entrance Stairs-Rails: Left Home Layout: One level     Bathroom Shower/Tub: Teacher, early years/pre: Standard (window near)     Home Equipment: Grab bars - tub/shower (thinks he has shower chair)          Prior Functioning/Environment Level of Independence: Independent             OT Diagnosis:     OT Problem List:     OT Treatment/Interventions:      OT Goals(Current goals can be found in the care plan section)    OT Frequency:     Barriers to D/C:             Co-evaluation              End of Session Equipment Utilized During Treatment: Gait belt;Back brace Nurse Communication: Mobility status; pain level  Activity Tolerance: Patient tolerated treatment well Patient left: in bed;with call bell/phone within reach   Time: 0815-0842 OT Time Calculation (min): 27 min Charges:  OT General Charges $OT Visit: 1 Procedure OT Evaluation $Initial OT Evaluation Tier I: 1 Procedure OT Treatments $Self Care/Home Management : 8-22 mins G-Codes:    Benito Mccreedy OTR/L [phone] 12/30/2013, 8:58 AM

## 2013-12-30 NOTE — Progress Notes (Signed)
Utilization review completed.  

## 2013-12-30 NOTE — Progress Notes (Signed)
Pt. Alert and oriented,follows simple instructions, denies pain. Incision area without swelling, redness or S/S of infection. Voiding adequate clear yellow urine. Moving all extremities well and vitals stable and documented. Patient discharged home with family.  Lumbar surgery notes instructions given to patient and family member for home safety and precautions. Pt. and family stated understanding of instructions given 

## 2013-12-31 ENCOUNTER — Encounter (HOSPITAL_COMMUNITY): Payer: Self-pay | Admitting: Neurosurgery

## 2014-01-28 DIAGNOSIS — M47817 Spondylosis without myelopathy or radiculopathy, lumbosacral region: Secondary | ICD-10-CM | POA: Diagnosis not present

## 2014-01-28 DIAGNOSIS — E782 Mixed hyperlipidemia: Secondary | ICD-10-CM | POA: Diagnosis not present

## 2014-01-28 DIAGNOSIS — Z6831 Body mass index (BMI) 31.0-31.9, adult: Secondary | ICD-10-CM | POA: Diagnosis not present

## 2014-01-28 DIAGNOSIS — M4310 Spondylolisthesis, site unspecified: Secondary | ICD-10-CM | POA: Diagnosis not present

## 2014-01-28 DIAGNOSIS — I10 Essential (primary) hypertension: Secondary | ICD-10-CM | POA: Diagnosis not present

## 2014-01-28 DIAGNOSIS — R252 Cramp and spasm: Secondary | ICD-10-CM | POA: Diagnosis not present

## 2014-02-25 DIAGNOSIS — E782 Mixed hyperlipidemia: Secondary | ICD-10-CM | POA: Diagnosis not present

## 2014-02-25 DIAGNOSIS — M47817 Spondylosis without myelopathy or radiculopathy, lumbosacral region: Secondary | ICD-10-CM | POA: Diagnosis not present

## 2014-02-25 DIAGNOSIS — R7301 Impaired fasting glucose: Secondary | ICD-10-CM | POA: Diagnosis not present

## 2014-02-25 DIAGNOSIS — Z125 Encounter for screening for malignant neoplasm of prostate: Secondary | ICD-10-CM | POA: Diagnosis not present

## 2014-02-25 DIAGNOSIS — M48062 Spinal stenosis, lumbar region with neurogenic claudication: Secondary | ICD-10-CM | POA: Diagnosis not present

## 2014-02-25 DIAGNOSIS — I10 Essential (primary) hypertension: Secondary | ICD-10-CM | POA: Diagnosis not present

## 2014-02-25 DIAGNOSIS — Z6841 Body Mass Index (BMI) 40.0 and over, adult: Secondary | ICD-10-CM | POA: Diagnosis not present

## 2014-02-27 DIAGNOSIS — R7309 Other abnormal glucose: Secondary | ICD-10-CM | POA: Diagnosis not present

## 2014-02-27 DIAGNOSIS — G589 Mononeuropathy, unspecified: Secondary | ICD-10-CM | POA: Diagnosis not present

## 2014-02-27 DIAGNOSIS — I10 Essential (primary) hypertension: Secondary | ICD-10-CM | POA: Diagnosis not present

## 2014-02-27 DIAGNOSIS — E782 Mixed hyperlipidemia: Secondary | ICD-10-CM | POA: Diagnosis not present

## 2014-03-26 DIAGNOSIS — Z6841 Body Mass Index (BMI) 40.0 and over, adult: Secondary | ICD-10-CM | POA: Diagnosis not present

## 2014-03-26 DIAGNOSIS — I10 Essential (primary) hypertension: Secondary | ICD-10-CM | POA: Diagnosis not present

## 2014-03-26 DIAGNOSIS — M48062 Spinal stenosis, lumbar region with neurogenic claudication: Secondary | ICD-10-CM | POA: Diagnosis not present

## 2014-03-27 DIAGNOSIS — I10 Essential (primary) hypertension: Secondary | ICD-10-CM | POA: Diagnosis not present

## 2014-03-27 DIAGNOSIS — E039 Hypothyroidism, unspecified: Secondary | ICD-10-CM | POA: Diagnosis not present

## 2014-03-27 DIAGNOSIS — D649 Anemia, unspecified: Secondary | ICD-10-CM | POA: Diagnosis not present

## 2014-03-27 DIAGNOSIS — R945 Abnormal results of liver function studies: Secondary | ICD-10-CM | POA: Diagnosis not present

## 2014-05-04 DIAGNOSIS — G3184 Mild cognitive impairment, so stated: Secondary | ICD-10-CM | POA: Diagnosis not present

## 2014-05-04 DIAGNOSIS — M549 Dorsalgia, unspecified: Secondary | ICD-10-CM | POA: Diagnosis not present

## 2014-05-05 ENCOUNTER — Other Ambulatory Visit (HOSPITAL_COMMUNITY): Payer: Self-pay | Admitting: Internal Medicine

## 2014-05-05 DIAGNOSIS — G3184 Mild cognitive impairment, so stated: Secondary | ICD-10-CM

## 2014-05-07 ENCOUNTER — Ambulatory Visit (HOSPITAL_COMMUNITY): Payer: Medicare Other

## 2014-05-28 DIAGNOSIS — I10 Essential (primary) hypertension: Secondary | ICD-10-CM | POA: Diagnosis not present

## 2014-05-28 DIAGNOSIS — M4310 Spondylolisthesis, site unspecified: Secondary | ICD-10-CM | POA: Diagnosis not present

## 2014-05-28 DIAGNOSIS — Z6830 Body mass index (BMI) 30.0-30.9, adult: Secondary | ICD-10-CM | POA: Diagnosis not present

## 2014-05-30 ENCOUNTER — Encounter (HOSPITAL_COMMUNITY): Payer: Self-pay | Admitting: Emergency Medicine

## 2014-05-30 ENCOUNTER — Emergency Department (HOSPITAL_COMMUNITY)
Admission: EM | Admit: 2014-05-30 | Discharge: 2014-05-30 | Disposition: A | Discharge status: Home / Self Care | Payer: Medicare Other | Attending: Emergency Medicine | Admitting: Emergency Medicine

## 2014-05-30 DIAGNOSIS — M129 Arthropathy, unspecified: Secondary | ICD-10-CM | POA: Insufficient documentation

## 2014-05-30 DIAGNOSIS — F329 Major depressive disorder, single episode, unspecified: Secondary | ICD-10-CM | POA: Insufficient documentation

## 2014-05-30 DIAGNOSIS — E785 Hyperlipidemia, unspecified: Secondary | ICD-10-CM | POA: Insufficient documentation

## 2014-05-30 DIAGNOSIS — F1110 Opioid abuse, uncomplicated: Secondary | ICD-10-CM | POA: Diagnosis present

## 2014-05-30 DIAGNOSIS — M549 Dorsalgia, unspecified: Secondary | ICD-10-CM | POA: Insufficient documentation

## 2014-05-30 DIAGNOSIS — Z862 Personal history of diseases of the blood and blood-forming organs and certain disorders involving the immune mechanism: Secondary | ICD-10-CM | POA: Insufficient documentation

## 2014-05-30 DIAGNOSIS — Z9889 Other specified postprocedural states: Secondary | ICD-10-CM | POA: Diagnosis not present

## 2014-05-30 DIAGNOSIS — G8929 Other chronic pain: Secondary | ICD-10-CM | POA: Diagnosis not present

## 2014-05-30 DIAGNOSIS — E781 Pure hyperglyceridemia: Secondary | ICD-10-CM | POA: Insufficient documentation

## 2014-05-30 DIAGNOSIS — I10 Essential (primary) hypertension: Secondary | ICD-10-CM | POA: Diagnosis not present

## 2014-05-30 DIAGNOSIS — Z79899 Other long term (current) drug therapy: Secondary | ICD-10-CM | POA: Insufficient documentation

## 2014-05-30 DIAGNOSIS — F3289 Other specified depressive episodes: Secondary | ICD-10-CM | POA: Insufficient documentation

## 2014-05-30 DIAGNOSIS — Z85038 Personal history of other malignant neoplasm of large intestine: Secondary | ICD-10-CM | POA: Diagnosis not present

## 2014-05-30 LAB — COMPREHENSIVE METABOLIC PANEL
ALT: 75 U/L — ABNORMAL HIGH (ref 0–53)
AST: 48 U/L — ABNORMAL HIGH (ref 0–37)
Albumin: 4.4 g/dL (ref 3.5–5.2)
Alkaline Phosphatase: 69 U/L (ref 39–117)
Anion gap: 15 (ref 5–15)
BUN: 18 mg/dL (ref 6–23)
CALCIUM: 10.1 mg/dL (ref 8.4–10.5)
CO2: 24 mEq/L (ref 19–32)
CREATININE: 0.87 mg/dL (ref 0.50–1.35)
Chloride: 102 mEq/L (ref 96–112)
GFR, EST NON AFRICAN AMERICAN: 85 mL/min — AB (ref >90)
GLUCOSE: 128 mg/dL — AB (ref 70–99)
Potassium: 3.9 mEq/L (ref 3.7–5.3)
Sodium: 141 mEq/L (ref 137–147)
Total Bilirubin: 0.4 mg/dL (ref 0.3–1.2)
Total Protein: 7.9 g/dL (ref 6.0–8.3)

## 2014-05-30 LAB — SALICYLATE LEVEL: Salicylate Lvl: <2 mg/dL — ABNORMAL LOW (ref 2.8–20.0)

## 2014-05-30 LAB — CBC
HEMATOCRIT: 37.1 % — AB (ref 39.0–52.0)
HEMOGLOBIN: 13 g/dL (ref 13.0–17.0)
MCH: 30.1 pg (ref 26.0–34.0)
MCHC: 35 g/dL (ref 30.0–36.0)
MCV: 85.9 fL (ref 78.0–100.0)
Platelets: 189 10*3/uL (ref 150–400)
RBC: 4.32 MIL/uL (ref 4.22–5.81)
RDW: 13.6 % (ref 11.5–15.5)
WBC: 3.9 10*3/uL — ABNORMAL LOW (ref 4.0–10.5)

## 2014-05-30 LAB — ETHANOL: Alcohol, Ethyl (B): <11 mg/dL (ref 0–11)

## 2014-05-30 LAB — ACETAMINOPHEN LEVEL

## 2014-05-30 MED ORDER — ONDANSETRON 4 MG PO TBDP: 8.0000 mg | ORAL_TABLET | Freq: Three times a day (TID) | ORAL | Status: DC | PRN

## 2014-05-30 MED ORDER — CLONIDINE HCL 0.1 MG PO TABS: ORAL_TABLET | ORAL | Status: DC

## 2014-05-30 MED ORDER — LOPERAMIDE HCL 2 MG PO CAPS: 2.0000 mg | ORAL_CAPSULE | Freq: Four times a day (QID) | ORAL | Status: DC | PRN

## 2014-05-30 MED ORDER — DICYCLOMINE HCL 20 MG PO TABS: 20.0000 mg | ORAL_TABLET | Freq: Four times a day (QID) | ORAL | Status: DC | PRN

## 2014-05-30 NOTE — ED Notes (Addendum)
Pt reports left back pain. Pt has been dealing with back pain for years, since 90's. Pt has been taking pain medications since. Pt reports that he has taken 15-20 pills of oxyCodone 15 mg tabs each today. Pt reports that he has been taking 15-20 pills a day all this week.

## 2014-05-30 NOTE — Discharge Instructions (Signed)
Opioid Withdrawal  Opioids are a group of narcotic drugs. They include the street drug heroin. They also include pain medicines, such as morphine, hydrocodone, oxycodone, and fentanyl. Opioid withdrawal is a group of characteristic physical and mental signs and symptoms. It typically occurs if you have been using opioids daily for several weeks or longer and stop using or rapidly decrease use. Opioid withdrawal can also occur if you have used opioids daily for a long time and are given a medicine to block the effect.   SIGNS AND SYMPTOMS  Opioid withdrawal includes three or more of the following symptoms:   · Depressed, anxious, or irritable mood.  · Nausea or vomiting.  · Muscle aches or spasms.    · Watery eyes.     · Runny nose.  · Dilated pupils, sweating, or hairs standing on end.  · Diarrhea or intestinal cramping.  · Yawning.    · Fever.  · Increased blood pressure.  · Fast pulse.  · Restlessness or trouble sleeping.  These signs and symptoms occur within several hours of stopping or reducing short-acting opioids, such as heroin. They can occur within 3 days of stopping or reducing long-acting opioids, such as methadone. Withdrawal begins within minutes of receiving a drug that blocks the effects of opioids, such as naltrexone or naloxone.  DIAGNOSIS   Opioid use disorder is diagnosed by your health care provider. You will be asked about your symptoms, drug and alcohol use, medical history, and use of medicines. A physical exam may be done. Lab tests may be ordered. Your health care provider may have you see a mental health professional.   TREATMENT   The treatment for opioid withdrawal is usually provided by medical doctors with special training in substance use disorders (addiction specialists). The following medicines may be included in treatment:  · Opioids given in place of the abused opioid. They turn on opioid receptors in the brain and lessen or prevent withdrawal symptoms. They are gradually  decreased (opioid substitution and taper).  · Non-opioids that can lessen certain opioid withdrawal symptoms. They may be used alone or with opioid substitution and taper.  Successful long-term recovery usually requires medicine, counseling, and group support.  HOME CARE INSTRUCTIONS   · Take medicines only as directed by your health care provider.  · Check with your health care provider before starting new medicines.  · Keep all follow-up visits as directed by your health care provider.  SEEK MEDICAL CARE IF:  · You are not able to take your medicines as directed.  · Your symptoms get worse.  · You relapse.  SEEK IMMEDIATE MEDICAL CARE IF:  · You have serious thoughts about hurting yourself or others.  · You have a seizure.  · You lose consciousness.  Document Released: 09/07/2003 Document Revised: 01/19/2014 Document Reviewed: 09/17/2013  [employer]® Patient Information ©2015 [employer]. This information is not intended to replace advice given to you by your health care provider. Make sure you discuss any questions you have with your health care provider.

## 2014-05-30 NOTE — ED Notes (Signed)
I gave the patient a cup of ice water per Dr. Alvino Chapel.

## 2014-05-30 NOTE — ED Notes (Signed)
Patient is unable to give a urine specimen at this time  

## 2014-05-30 NOTE — ED Provider Notes (Signed)
CSN: [id number]     Arrival date & time 05/30/14  1655 History   First MD Initiated Contact with Patient 05/30/14 1738     Chief Complaint  Patient presents with  . Drug Overdose  . Back Pain     (Consider location/radiation/quality/duration/timing/severity/associated sxs/prior Treatment) Patient is a 73 y.o. male presenting with Overdose and back pain. The history is provided by the patient.  Drug Overdose This is a new problem. Pertinent negatives include no chest pain, no abdominal pain, no headaches and no shortness of breath.  Back Pain Associated symptoms: no abdominal pain, no chest pain, no headaches, no numbness and no weakness    patient's been on chronic pain medicine for years. He states he takes 15 mg of oxycodone 2 tabs at a time 4 times a day. He states his wife usually controls his medicines. He states that a day ago because of his illness he was able to do it. He states he then took around 15-20 pills. He states he took all of them because he wants to get off the medication. He states that he took all of them he thought that he would have to help get help from [place]. He denies suicidal thoughts. He denies depression. He states he wants help to come off the pain medicines. He states it was withdrawn the past he has had depression and suicidal thoughts, but is not depressed or suicidal at this time. He denies other substance abuse. He states he knew that he would take more medication than he was supposed to if he had the availability of them.  Past Medical History  Diagnosis Date  . Hypertension   . Colon cancer   . Arthritis   . Anemia   . Chronic back pain     pain medication for 20 years  . Depression   . Tremor   . Hyperlipidemia   . High triglycerides   . Sleep apnea     does not use CPAP  . Neuromuscular disorder     tremors in both arms and legs   Past Surgical History  Procedure Laterality Date  . Colon resection  08/1998  . Appendectomy    . Knee surgery  Left   . Vasectomy    . Cataract extraction      bilateral  . Shoulder open rotator cuff repair Right 11/07/2012    Procedure: ROTATOR CUFF REPAIR SHOULDER OPEN;  Surgeon: Sanjuana Kava, MD;  Location: [facility];  Service: Orthopedics;  Laterality: Right;  Right Open Rotator Cuff Repair  . Shoulder acromioplasty Right 11/07/2012    Procedure: SHOULDER ACROMIOPLASTY;  Surgeon: Sanjuana Kava, MD;  Location: [facility];  Service: Orthopedics;  Laterality: Right;  . Portacath placement      and removal  . Lumbar laminectomy/decompression microdiscectomy Bilateral 01/10/2013    Procedure: LUMBAR LAMINECTOMY/DECOMPRESSION MICRODISCECTOMY 2 LEVELS;  Surgeon: Charlie Pitter, MD;  Location: [facility];  Service: Neurosurgery;  Laterality: Bilateral;  Bilateral lumbar three-four,Lumbar four-five  . Back surgery    . Colonoscopy N/A 05/09/2013    Procedure: COLONOSCOPY;  Surgeon: Danie Binder, MD;  Location: [facility];  Service: Endoscopy;  Laterality: N/A;  10:45  . Hernia repair Bilateral   . Anterior lateral lumbar fusion with percutaneous screw 1 level Left 12/29/2013    Procedure: LUMBAR THREE TO LUMBAR FOUR ANTERIOR LATERAL LUMBAR FUSION WITH PERCUTANEOUS SCREW 1 LEVEL;  Surgeon: Charlie Pitter, MD;  Location: [facility];  Service: Neurosurgery;  Laterality:  Left;   Family History  Problem Relation Age of Onset  . Colon cancer Neg Hx    History  Substance Use Topics  . Smoking status: Never Smoker   . Smokeless tobacco: Never Used  . Alcohol Use: No     Comment: patient quit in 1988. patient drinks 5 cups of coffee weekly    Review of Systems  Constitutional: Negative for activity change and appetite change.  Eyes: Negative for pain.  Respiratory: Negative for chest tightness and shortness of breath.   Cardiovascular: Negative for chest pain and leg swelling.  Gastrointestinal: Negative for nausea, vomiting, abdominal pain and diarrhea.  Genitourinary: Negative for flank pain.   Musculoskeletal: Positive for back pain. Negative for neck stiffness.  Skin: Negative for rash.  Neurological: Negative for weakness, numbness and headaches.  Psychiatric/Behavioral: Negative for behavioral problems.      Allergies  Morphine and related  Home Medications   Prior to Admission medications   Medication Sig Start Date End Date Taking? Authorizing Provider  levothyroxine (SYNTHROID, LEVOTHROID) 25 MCG tablet Take 25 mcg by mouth daily. 05/05/14  Yes Historical Provider, MD  olmesartan-hydrochlorothiazide (BENICAR HCT) 40-25 MG per tablet Take 1 tablet by mouth daily.   Yes Historical Provider, MD  OxyCODONE (OXYCONTIN) 40 mg T12A 12 hr tablet Take 40 mg by mouth every 12 (twelve) hours.   Yes Historical Provider, MD  oxyCODONE (ROXICODONE) 15 MG immediate release tablet Take 1-2 tablets (15-30 mg total) by mouth every 6 (six) hours. 12/30/13  Yes Charlie Pitter, MD  rosuvastatin (CRESTOR) 5 MG tablet Take 5 mg by mouth daily.   Yes Historical Provider, MD  cloNIDine (CATAPRES) 0.1 MG tablet 1 tab po q 8 hrs for 6 doses, then 1 tab po q am and qhs for 4 doses, then 1 tab q am for 2 doses. 05/30/14   Jasper Riling. Kailon Treese, MD  dicyclomine (BENTYL) 20 MG tablet Take 1 tablet (20 mg total) by mouth every 6 (six) hours as needed for spasms. 05/30/14   Jasper Riling. Alvino Chapel, MD  loperamide (IMODIUM) 2 MG capsule Take 1 capsule (2 mg total) by mouth 4 (four) times daily as needed for diarrhea or loose stools. 05/30/14   Jasper Riling. Alvino Chapel, MD  ondansetron (ZOFRAN-ODT) 4 MG disintegrating tablet Take 2 tablets (8 mg total) by mouth every 8 (eight) hours as needed for nausea or vomiting. 05/30/14   Jasper Riling. Desirre Eickhoff, MD   BP 176/82  Pulse 56  Temp(Src) 98.4 F (36.9 C) (Oral)  Resp 16  Ht 5\' 8"  (1.727 m)  Wt 203 lb (92.08 kg)  BMI 30.87 kg/m2  SpO2 97% Physical Exam  Nursing note and vitals reviewed. Constitutional: He is oriented to person, place, and time. He appears well-developed  and well-nourished.  HENT:  Head: Normocephalic and atraumatic.  Eyes: EOM are normal. Pupils are equal, round, and reactive to light.  Neck: Normal range of motion. Neck supple.  Cardiovascular: Normal rate, regular rhythm and normal heart sounds.   No murmur heard. Pulmonary/Chest: Effort normal and breath sounds normal.  Abdominal: Soft. Bowel sounds are normal. He exhibits no distension and no mass. There is no tenderness. There is no rebound and no guarding.  Musculoskeletal: Normal range of motion. He exhibits no edema.  Neurological: He is alert and oriented to person, place, and time. No cranial nerve deficit.  Skin: Skin is warm and dry.  Psychiatric: He has a normal mood and affect.    ED Course  Procedures (  including critical care time) Labs Review Labs Reviewed  CBC - Abnormal; Notable for the following:    WBC 3.9 (*)    HCT 37.1 (*)    All other components within normal limits  COMPREHENSIVE METABOLIC PANEL - Abnormal; Notable for the following:    Glucose, Bld 128 (*)    AST 48 (*)    ALT 75 (*)    GFR calc non Af Amer 85 (*)    All other components within normal limits  SALICYLATE LEVEL - Abnormal; Notable for the following:    Salicylate Lvl <0.3 (*)    All other components within normal limits  ETHANOL  ACETAMINOPHEN LEVEL  URINE RAPID DRUG SCREEN (HOSP PERFORMED)    Imaging Review No results found.   EKG Interpretation None      MDM   Final diagnoses:  Opioid abuse    Patient abuses opioids. He states he took them to have to get treatment. He is not appear to be in distress at this time. Denies other abuse. He is not suicidal homicidal. Initial be started on a home version of the opioid detox protocol. He was given followup information. Will discharge home. Does not appear to be a risk to himself at this time, however he states he has had depression with withdrawal in the past and will return if he begins to feel it again    [employer].  Alvino Chapel, MD 05/30/14 [phone]

## 2014-06-04 DIAGNOSIS — R7301 Impaired fasting glucose: Secondary | ICD-10-CM | POA: Diagnosis not present

## 2014-06-04 DIAGNOSIS — E782 Mixed hyperlipidemia: Secondary | ICD-10-CM | POA: Diagnosis not present

## 2014-06-04 DIAGNOSIS — I10 Essential (primary) hypertension: Secondary | ICD-10-CM | POA: Diagnosis not present

## 2014-06-08 DIAGNOSIS — I10 Essential (primary) hypertension: Secondary | ICD-10-CM | POA: Diagnosis not present

## 2014-06-08 DIAGNOSIS — G8929 Other chronic pain: Secondary | ICD-10-CM | POA: Diagnosis not present

## 2014-06-08 DIAGNOSIS — R7309 Other abnormal glucose: Secondary | ICD-10-CM | POA: Diagnosis not present

## 2014-06-08 DIAGNOSIS — E785 Hyperlipidemia, unspecified: Secondary | ICD-10-CM | POA: Diagnosis not present

## 2014-06-17 DIAGNOSIS — M47817 Spondylosis without myelopathy or radiculopathy, lumbosacral region: Secondary | ICD-10-CM | POA: Diagnosis not present

## 2014-06-17 DIAGNOSIS — Z6832 Body mass index (BMI) 32.0-32.9, adult: Secondary | ICD-10-CM | POA: Diagnosis not present

## 2014-06-17 DIAGNOSIS — M545 Low back pain, unspecified: Secondary | ICD-10-CM | POA: Diagnosis not present

## 2014-06-17 DIAGNOSIS — M48062 Spinal stenosis, lumbar region with neurogenic claudication: Secondary | ICD-10-CM | POA: Diagnosis not present

## 2014-06-17 DIAGNOSIS — M4310 Spondylolisthesis, site unspecified: Secondary | ICD-10-CM | POA: Diagnosis not present

## 2014-07-06 DIAGNOSIS — I10 Essential (primary) hypertension: Secondary | ICD-10-CM | POA: Diagnosis not present

## 2014-07-06 DIAGNOSIS — R1084 Generalized abdominal pain: Secondary | ICD-10-CM | POA: Diagnosis not present

## 2014-07-06 DIAGNOSIS — E039 Hypothyroidism, unspecified: Secondary | ICD-10-CM | POA: Diagnosis not present

## 2014-07-08 DIAGNOSIS — M47817 Spondylosis without myelopathy or radiculopathy, lumbosacral region: Secondary | ICD-10-CM | POA: Diagnosis not present

## 2014-07-08 DIAGNOSIS — I10 Essential (primary) hypertension: Secondary | ICD-10-CM | POA: Diagnosis not present

## 2014-07-08 DIAGNOSIS — Z6830 Body mass index (BMI) 30.0-30.9, adult: Secondary | ICD-10-CM | POA: Diagnosis not present

## 2014-07-13 DIAGNOSIS — I10 Essential (primary) hypertension: Secondary | ICD-10-CM | POA: Diagnosis not present

## 2014-07-13 DIAGNOSIS — M47817 Spondylosis without myelopathy or radiculopathy, lumbosacral region: Secondary | ICD-10-CM | POA: Diagnosis not present

## 2014-07-13 DIAGNOSIS — Z6831 Body mass index (BMI) 31.0-31.9, adult: Secondary | ICD-10-CM | POA: Diagnosis not present

## 2014-08-04 IMAGING — CT CT ABD-PELV W/ CM
2 of 5 series · 14 of 32 positions shown, 19 images · IV contrast (water/omni  & 100ml omni 300)
Comparison: None.

CLINICAL DATA: Abdominal distention.  Weight gain.  History of
colon cancer with colon resection.  Appendectomy.

CT ABDOMEN AND PELVIS WITH CONTRAST
TECHNIQUE: Multidetector CT imaging of the abdomen and pelvis was
performed following the standard protocol during bolus
administration of intravenous contrast.
Contrast: 100mL OMNIPAQUE IOHEXOL 300 MG/ML  SOLN

[Series 2: routine abdomen · axial · 0.95mm/px · z∈[-445,-110]mm · 6 of 95 slices shown, 11 images]
[im 14/95  soft-tissue]
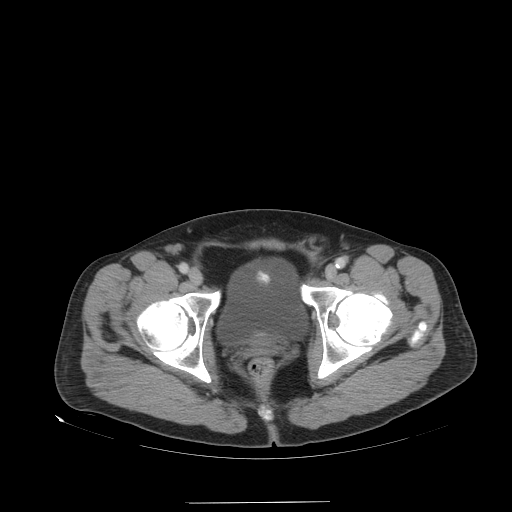
[im 14/95  bone]
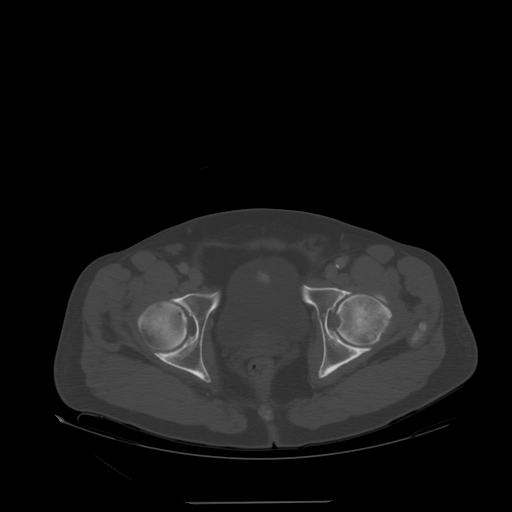
[im 27/95  soft-tissue]
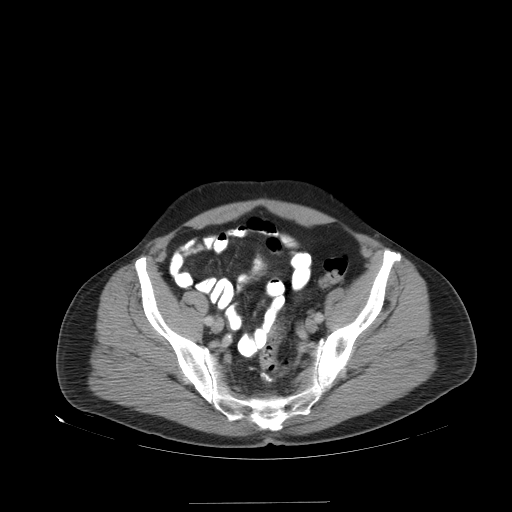
[im 41/95  soft-tissue]
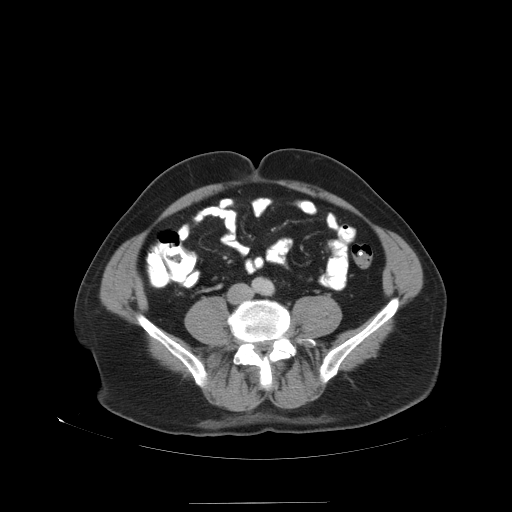
[im 41/95  lung]
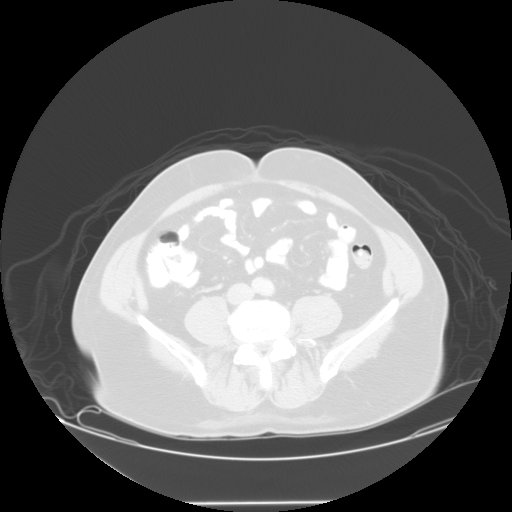
[im 54/95  soft-tissue]
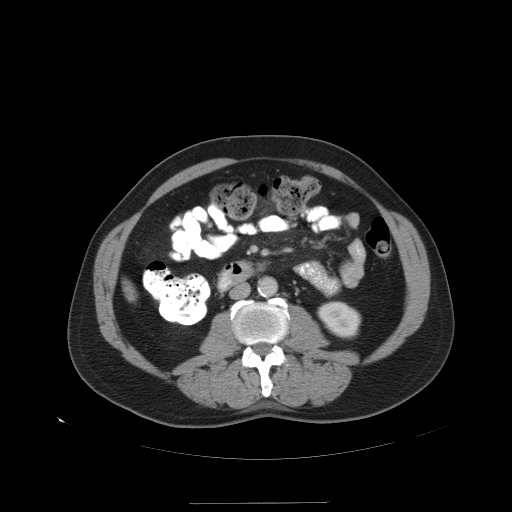
[im 54/95  lung]
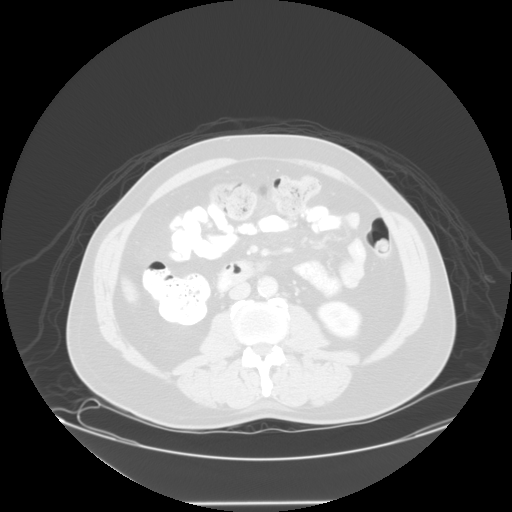
[im 68/95  soft-tissue]
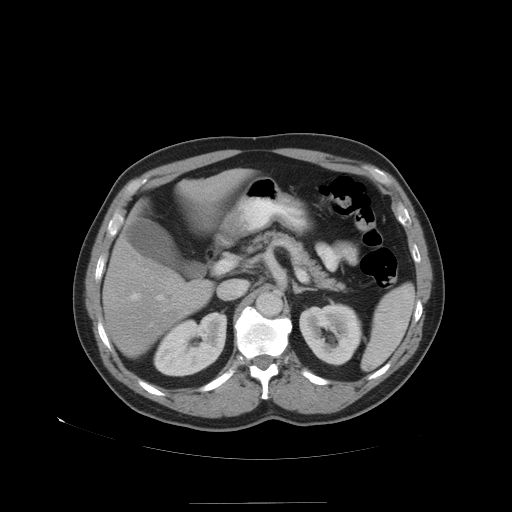
[im 68/95  lung]
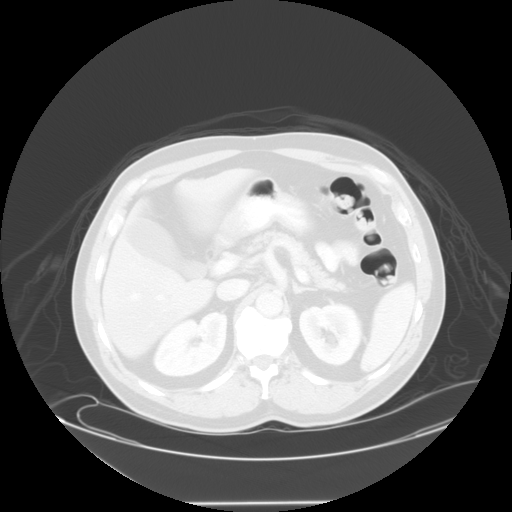
[im 81/95  soft-tissue]
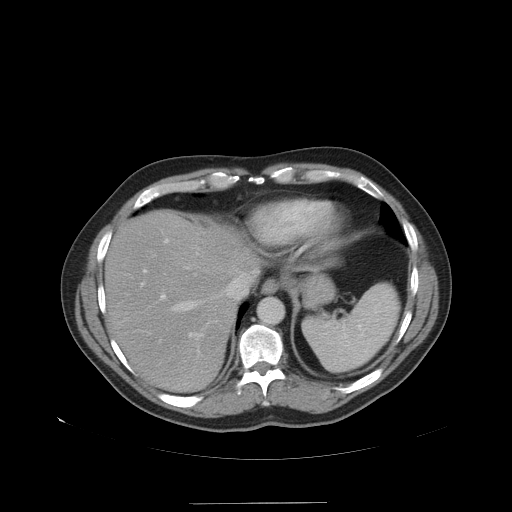
[im 81/95  lung]
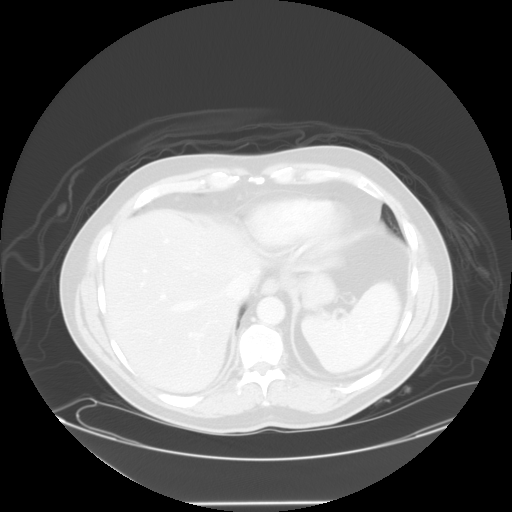

[Series 401: sagittals · sagittal · 0.95mm/px · 8 of 117 slices shown]
[im 13/117  soft-tissue]
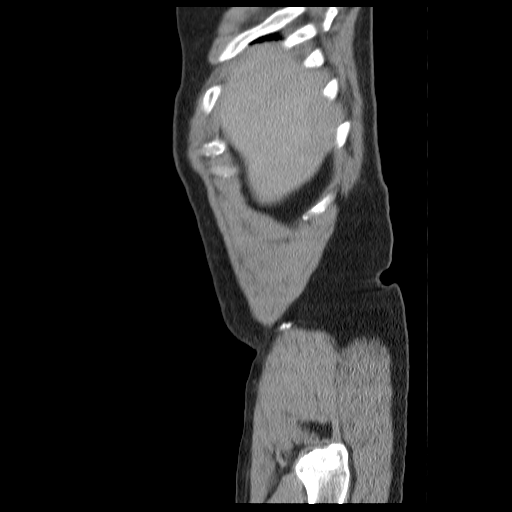
[im 26/117  soft-tissue]
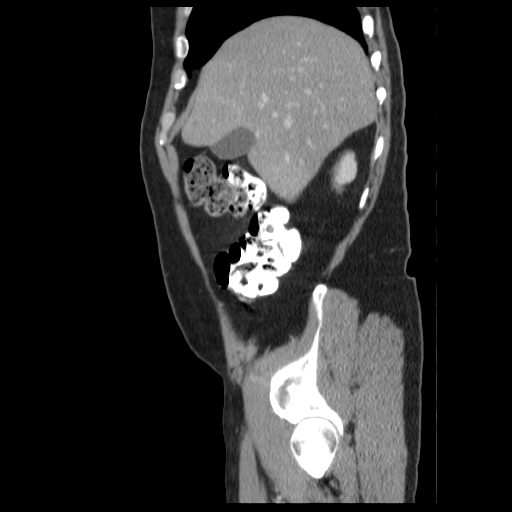
[im 39/117  soft-tissue]
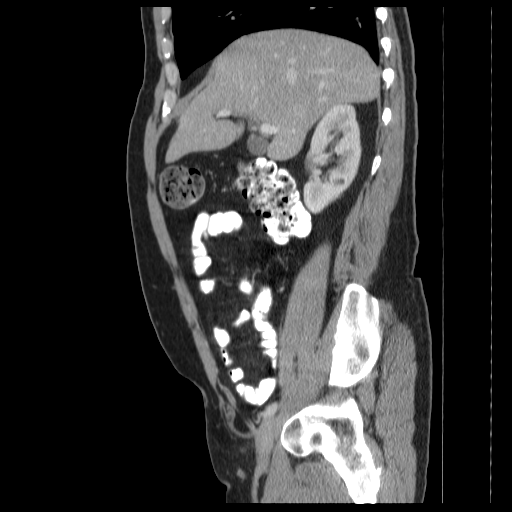
[im 52/117  soft-tissue]
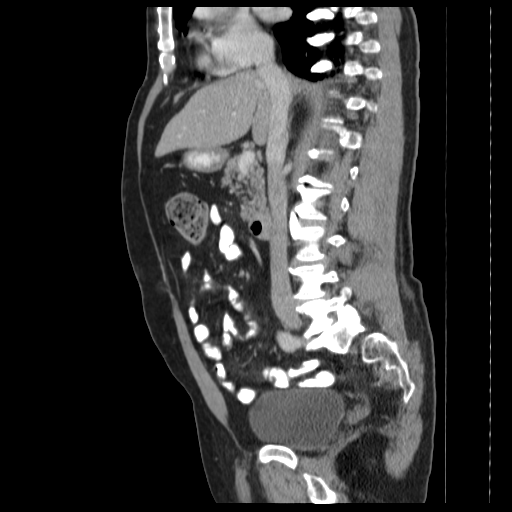
[im 65/117  soft-tissue]
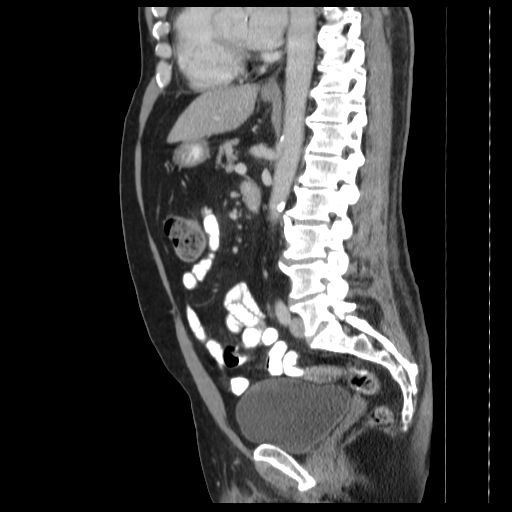
[im 78/117  soft-tissue]
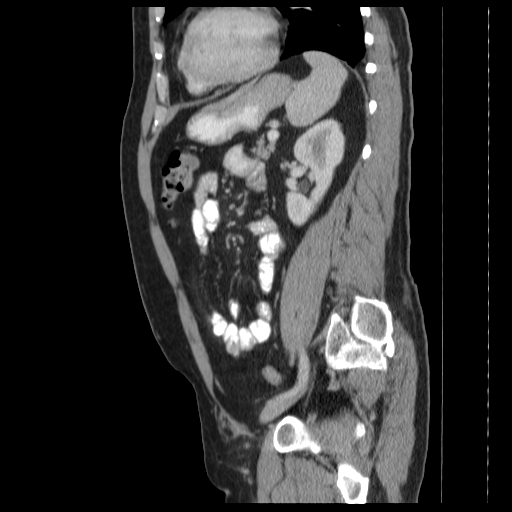
[im 91/117  soft-tissue]
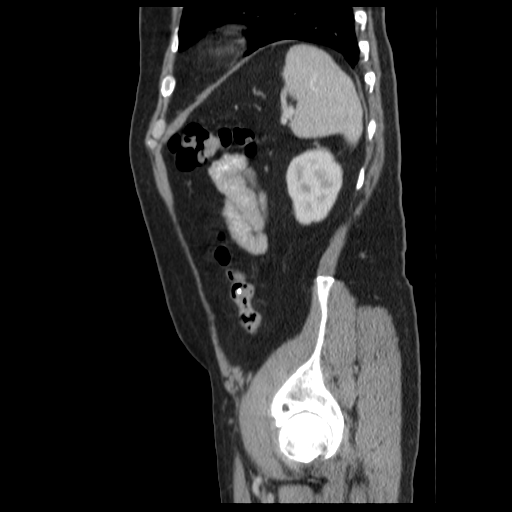
[im 104/117  soft-tissue]
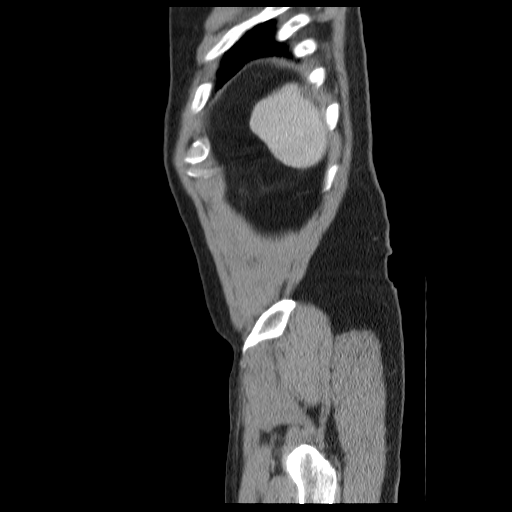

[14 of 32 positions shown; findings below may reference images not displayed]

FINDINGS: Lung Bases: Dependent atelectasis.

Liver:  Normal.

Spleen:  Normal.

Gallbladder:  Normal.

Common bile duct:  Normal.

Pancreas:  Normal.

Adrenal glands:  Normal.

Kidneys:  Normal enhancement and excretion of contrast.  5 mm right
inferior pole low density lesion likely represents a tiny renal
cyst.  The ureters appear within normal limits.

Stomach:  Normal.

Small bowel:  Normal.  No obstruction.  No mesenteric adenopathy.
No free air or free fluid.

Colon:   Cecum and ascending colon appear normal.  No right lower
quadrant inflammatory changes.  Colon is distended with stool.
Surgical staple line present at the rectosigmoid junction.

Pelvic Genitourinary:  Normal.  No free fluid.

Bones:  Normal.  Lumbar spondylosis and scattered Schmorl's nodes.

Vasculature: Mild abdominal aortic atherosclerosis without
aneurysm.
IMPRESSION: 1.  No acute abnormality.
2.  Partial sigmoid resection with staple line at the junction of
the rectum and sigmoid.

## 2014-08-06 DIAGNOSIS — F4542 Pain disorder with related psychological factors: Secondary | ICD-10-CM | POA: Diagnosis not present

## 2014-08-24 DIAGNOSIS — I10 Essential (primary) hypertension: Secondary | ICD-10-CM | POA: Diagnosis not present

## 2014-08-24 DIAGNOSIS — E291 Testicular hypofunction: Secondary | ICD-10-CM | POA: Diagnosis not present

## 2014-08-24 DIAGNOSIS — E785 Hyperlipidemia, unspecified: Secondary | ICD-10-CM | POA: Diagnosis not present

## 2014-08-26 DIAGNOSIS — E291 Testicular hypofunction: Secondary | ICD-10-CM | POA: Diagnosis not present

## 2014-08-26 DIAGNOSIS — F331 Major depressive disorder, recurrent, moderate: Secondary | ICD-10-CM | POA: Diagnosis not present

## 2014-08-26 DIAGNOSIS — I10 Essential (primary) hypertension: Secondary | ICD-10-CM | POA: Diagnosis not present

## 2014-09-22 DIAGNOSIS — M545 Low back pain: Secondary | ICD-10-CM | POA: Diagnosis not present

## 2014-09-22 DIAGNOSIS — M961 Postlaminectomy syndrome, not elsewhere classified: Secondary | ICD-10-CM | POA: Diagnosis not present

## 2014-09-22 DIAGNOSIS — G8929 Other chronic pain: Secondary | ICD-10-CM | POA: Diagnosis not present

## 2014-09-28 DIAGNOSIS — M961 Postlaminectomy syndrome, not elsewhere classified: Secondary | ICD-10-CM | POA: Diagnosis not present

## 2014-09-28 DIAGNOSIS — M545 Low back pain: Secondary | ICD-10-CM | POA: Diagnosis not present

## 2014-10-06 DIAGNOSIS — G8929 Other chronic pain: Secondary | ICD-10-CM | POA: Diagnosis not present

## 2014-10-06 DIAGNOSIS — M961 Postlaminectomy syndrome, not elsewhere classified: Secondary | ICD-10-CM | POA: Diagnosis not present

## 2014-10-06 DIAGNOSIS — M47817 Spondylosis without myelopathy or radiculopathy, lumbosacral region: Secondary | ICD-10-CM | POA: Diagnosis not present

## 2014-10-06 DIAGNOSIS — M545 Low back pain: Secondary | ICD-10-CM | POA: Diagnosis not present

## 2014-10-06 DIAGNOSIS — I10 Essential (primary) hypertension: Secondary | ICD-10-CM | POA: Diagnosis not present

## 2014-10-28 DIAGNOSIS — F331 Major depressive disorder, recurrent, moderate: Secondary | ICD-10-CM | POA: Diagnosis not present

## 2014-10-28 DIAGNOSIS — Z6832 Body mass index (BMI) 32.0-32.9, adult: Secondary | ICD-10-CM | POA: Diagnosis not present

## 2014-10-29 DIAGNOSIS — M4310 Spondylolisthesis, site unspecified: Secondary | ICD-10-CM | POA: Diagnosis not present

## 2014-10-29 DIAGNOSIS — Z6831 Body mass index (BMI) 31.0-31.9, adult: Secondary | ICD-10-CM | POA: Diagnosis not present

## 2014-10-29 DIAGNOSIS — I10 Essential (primary) hypertension: Secondary | ICD-10-CM | POA: Diagnosis not present

## 2014-11-04 DIAGNOSIS — M4310 Spondylolisthesis, site unspecified: Secondary | ICD-10-CM | POA: Diagnosis not present

## 2014-11-05 DIAGNOSIS — M4326 Fusion of spine, lumbar region: Secondary | ICD-10-CM | POA: Diagnosis not present

## 2014-11-05 DIAGNOSIS — M4806 Spinal stenosis, lumbar region: Secondary | ICD-10-CM | POA: Diagnosis not present

## 2014-11-05 DIAGNOSIS — M4310 Spondylolisthesis, site unspecified: Secondary | ICD-10-CM | POA: Diagnosis not present

## 2014-11-12 DIAGNOSIS — M4310 Spondylolisthesis, site unspecified: Secondary | ICD-10-CM | POA: Diagnosis not present

## 2014-12-09 DIAGNOSIS — Z6831 Body mass index (BMI) 31.0-31.9, adult: Secondary | ICD-10-CM | POA: Diagnosis not present

## 2014-12-09 DIAGNOSIS — M961 Postlaminectomy syndrome, not elsewhere classified: Secondary | ICD-10-CM | POA: Diagnosis not present

## 2014-12-09 DIAGNOSIS — M545 Low back pain: Secondary | ICD-10-CM | POA: Diagnosis not present

## 2015-01-04 DIAGNOSIS — G894 Chronic pain syndrome: Secondary | ICD-10-CM | POA: Diagnosis not present

## 2015-01-04 DIAGNOSIS — Z6832 Body mass index (BMI) 32.0-32.9, adult: Secondary | ICD-10-CM | POA: Diagnosis not present

## 2015-01-04 DIAGNOSIS — R251 Tremor, unspecified: Secondary | ICD-10-CM | POA: Diagnosis not present

## 2015-01-04 DIAGNOSIS — G3184 Mild cognitive impairment, so stated: Secondary | ICD-10-CM | POA: Diagnosis not present

## 2015-01-20 DIAGNOSIS — Z6830 Body mass index (BMI) 30.0-30.9, adult: Secondary | ICD-10-CM | POA: Diagnosis not present

## 2015-01-20 DIAGNOSIS — I10 Essential (primary) hypertension: Secondary | ICD-10-CM | POA: Diagnosis not present

## 2015-01-20 DIAGNOSIS — M961 Postlaminectomy syndrome, not elsewhere classified: Secondary | ICD-10-CM | POA: Diagnosis not present

## 2015-01-20 DIAGNOSIS — M545 Low back pain: Secondary | ICD-10-CM | POA: Diagnosis not present

## 2015-01-26 DIAGNOSIS — Z125 Encounter for screening for malignant neoplasm of prostate: Secondary | ICD-10-CM | POA: Diagnosis not present

## 2015-01-26 DIAGNOSIS — E119 Type 2 diabetes mellitus without complications: Secondary | ICD-10-CM | POA: Diagnosis not present

## 2015-01-26 DIAGNOSIS — E039 Hypothyroidism, unspecified: Secondary | ICD-10-CM | POA: Diagnosis not present

## 2015-01-26 DIAGNOSIS — I10 Essential (primary) hypertension: Secondary | ICD-10-CM | POA: Diagnosis not present

## 2015-01-26 DIAGNOSIS — E291 Testicular hypofunction: Secondary | ICD-10-CM | POA: Diagnosis not present

## 2015-02-01 DIAGNOSIS — G3184 Mild cognitive impairment, so stated: Secondary | ICD-10-CM | POA: Diagnosis not present

## 2015-02-01 DIAGNOSIS — F331 Major depressive disorder, recurrent, moderate: Secondary | ICD-10-CM | POA: Diagnosis not present

## 2015-02-01 DIAGNOSIS — G894 Chronic pain syndrome: Secondary | ICD-10-CM | POA: Diagnosis not present

## 2015-02-01 DIAGNOSIS — E291 Testicular hypofunction: Secondary | ICD-10-CM | POA: Diagnosis not present

## 2015-03-18 DIAGNOSIS — Z6831 Body mass index (BMI) 31.0-31.9, adult: Secondary | ICD-10-CM | POA: Diagnosis not present

## 2015-03-18 DIAGNOSIS — I10 Essential (primary) hypertension: Secondary | ICD-10-CM | POA: Diagnosis not present

## 2015-03-18 DIAGNOSIS — M961 Postlaminectomy syndrome, not elsewhere classified: Secondary | ICD-10-CM | POA: Diagnosis not present

## 2015-03-29 DIAGNOSIS — R251 Tremor, unspecified: Secondary | ICD-10-CM | POA: Diagnosis not present

## 2015-03-29 DIAGNOSIS — I10 Essential (primary) hypertension: Secondary | ICD-10-CM | POA: Diagnosis not present

## 2015-04-20 DIAGNOSIS — M545 Low back pain: Secondary | ICD-10-CM | POA: Diagnosis not present

## 2015-04-20 DIAGNOSIS — G8929 Other chronic pain: Secondary | ICD-10-CM | POA: Diagnosis not present

## 2015-04-20 DIAGNOSIS — M4310 Spondylolisthesis, site unspecified: Secondary | ICD-10-CM | POA: Diagnosis not present

## 2015-04-20 DIAGNOSIS — M47817 Spondylosis without myelopathy or radiculopathy, lumbosacral region: Secondary | ICD-10-CM | POA: Diagnosis not present

## 2015-04-20 DIAGNOSIS — M961 Postlaminectomy syndrome, not elsewhere classified: Secondary | ICD-10-CM | POA: Diagnosis not present

## 2015-04-20 DIAGNOSIS — Z6832 Body mass index (BMI) 32.0-32.9, adult: Secondary | ICD-10-CM | POA: Diagnosis not present

## 2015-04-20 DIAGNOSIS — I10 Essential (primary) hypertension: Secondary | ICD-10-CM | POA: Diagnosis not present

## 2015-05-28 DIAGNOSIS — E039 Hypothyroidism, unspecified: Secondary | ICD-10-CM | POA: Diagnosis not present

## 2015-05-28 DIAGNOSIS — E785 Hyperlipidemia, unspecified: Secondary | ICD-10-CM | POA: Diagnosis not present

## 2015-05-28 DIAGNOSIS — I10 Essential (primary) hypertension: Secondary | ICD-10-CM | POA: Diagnosis not present

## 2015-05-28 DIAGNOSIS — E291 Testicular hypofunction: Secondary | ICD-10-CM | POA: Diagnosis not present

## 2015-05-28 DIAGNOSIS — R7301 Impaired fasting glucose: Secondary | ICD-10-CM | POA: Diagnosis not present

## 2015-06-01 DIAGNOSIS — I10 Essential (primary) hypertension: Secondary | ICD-10-CM | POA: Diagnosis not present

## 2015-06-01 DIAGNOSIS — Z23 Encounter for immunization: Secondary | ICD-10-CM | POA: Diagnosis not present

## 2015-06-01 DIAGNOSIS — F339 Major depressive disorder, recurrent, unspecified: Secondary | ICD-10-CM | POA: Diagnosis not present

## 2015-06-01 DIAGNOSIS — E119 Type 2 diabetes mellitus without complications: Secondary | ICD-10-CM | POA: Diagnosis not present

## 2015-06-01 DIAGNOSIS — E291 Testicular hypofunction: Secondary | ICD-10-CM | POA: Diagnosis not present

## 2015-06-17 DIAGNOSIS — M47817 Spondylosis without myelopathy or radiculopathy, lumbosacral region: Secondary | ICD-10-CM | POA: Diagnosis not present

## 2015-06-17 DIAGNOSIS — Z6832 Body mass index (BMI) 32.0-32.9, adult: Secondary | ICD-10-CM | POA: Diagnosis not present

## 2015-07-22 DIAGNOSIS — M47817 Spondylosis without myelopathy or radiculopathy, lumbosacral region: Secondary | ICD-10-CM | POA: Diagnosis not present

## 2015-07-22 DIAGNOSIS — M4310 Spondylolisthesis, site unspecified: Secondary | ICD-10-CM | POA: Diagnosis not present

## 2015-07-22 DIAGNOSIS — Z6832 Body mass index (BMI) 32.0-32.9, adult: Secondary | ICD-10-CM | POA: Diagnosis not present

## 2015-07-22 DIAGNOSIS — M961 Postlaminectomy syndrome, not elsewhere classified: Secondary | ICD-10-CM | POA: Diagnosis not present

## 2015-07-22 DIAGNOSIS — M545 Low back pain: Secondary | ICD-10-CM | POA: Diagnosis not present

## 2015-07-22 DIAGNOSIS — I10 Essential (primary) hypertension: Secondary | ICD-10-CM | POA: Diagnosis not present

## 2015-07-22 DIAGNOSIS — G8929 Other chronic pain: Secondary | ICD-10-CM | POA: Diagnosis not present

## 2015-10-20 DIAGNOSIS — M961 Postlaminectomy syndrome, not elsewhere classified: Secondary | ICD-10-CM | POA: Diagnosis not present

## 2015-10-20 DIAGNOSIS — M545 Low back pain: Secondary | ICD-10-CM | POA: Diagnosis not present

## 2015-10-20 DIAGNOSIS — G8929 Other chronic pain: Secondary | ICD-10-CM | POA: Diagnosis not present

## 2015-10-20 DIAGNOSIS — M4310 Spondylolisthesis, site unspecified: Secondary | ICD-10-CM | POA: Diagnosis not present

## 2015-10-20 DIAGNOSIS — M47817 Spondylosis without myelopathy or radiculopathy, lumbosacral region: Secondary | ICD-10-CM | POA: Diagnosis not present

## 2015-11-05 DIAGNOSIS — I10 Essential (primary) hypertension: Secondary | ICD-10-CM | POA: Diagnosis not present

## 2015-11-05 DIAGNOSIS — E039 Hypothyroidism, unspecified: Secondary | ICD-10-CM | POA: Diagnosis not present

## 2015-11-05 DIAGNOSIS — Z125 Encounter for screening for malignant neoplasm of prostate: Secondary | ICD-10-CM | POA: Diagnosis not present

## 2015-11-05 DIAGNOSIS — E291 Testicular hypofunction: Secondary | ICD-10-CM | POA: Diagnosis not present

## 2015-11-05 DIAGNOSIS — E119 Type 2 diabetes mellitus without complications: Secondary | ICD-10-CM | POA: Diagnosis not present

## 2015-11-10 DIAGNOSIS — F339 Major depressive disorder, recurrent, unspecified: Secondary | ICD-10-CM | POA: Diagnosis not present

## 2015-11-10 DIAGNOSIS — R251 Tremor, unspecified: Secondary | ICD-10-CM | POA: Diagnosis not present

## 2015-11-10 DIAGNOSIS — E782 Mixed hyperlipidemia: Secondary | ICD-10-CM | POA: Diagnosis not present

## 2015-11-10 DIAGNOSIS — N529 Male erectile dysfunction, unspecified: Secondary | ICD-10-CM | POA: Diagnosis not present

## 2015-11-10 DIAGNOSIS — G894 Chronic pain syndrome: Secondary | ICD-10-CM | POA: Diagnosis not present

## 2015-11-10 DIAGNOSIS — E119 Type 2 diabetes mellitus without complications: Secondary | ICD-10-CM | POA: Diagnosis not present

## 2015-11-10 DIAGNOSIS — I10 Essential (primary) hypertension: Secondary | ICD-10-CM | POA: Diagnosis not present

## 2015-11-10 DIAGNOSIS — E291 Testicular hypofunction: Secondary | ICD-10-CM | POA: Diagnosis not present

## 2015-11-10 DIAGNOSIS — M545 Low back pain: Secondary | ICD-10-CM | POA: Diagnosis not present

## 2016-01-12 DIAGNOSIS — M545 Low back pain: Secondary | ICD-10-CM | POA: Diagnosis not present

## 2016-01-12 DIAGNOSIS — M961 Postlaminectomy syndrome, not elsewhere classified: Secondary | ICD-10-CM | POA: Diagnosis not present

## 2016-01-12 DIAGNOSIS — M4310 Spondylolisthesis, site unspecified: Secondary | ICD-10-CM | POA: Diagnosis not present

## 2016-01-12 DIAGNOSIS — M47817 Spondylosis without myelopathy or radiculopathy, lumbosacral region: Secondary | ICD-10-CM | POA: Diagnosis not present

## 2016-01-12 DIAGNOSIS — Z6832 Body mass index (BMI) 32.0-32.9, adult: Secondary | ICD-10-CM | POA: Diagnosis not present

## 2016-01-12 DIAGNOSIS — G8929 Other chronic pain: Secondary | ICD-10-CM | POA: Diagnosis not present

## 2016-03-22 ENCOUNTER — Encounter: Payer: Self-pay | Admitting: Gastroenterology

## 2016-04-06 DIAGNOSIS — M545 Low back pain: Secondary | ICD-10-CM | POA: Diagnosis not present

## 2016-04-06 DIAGNOSIS — M47817 Spondylosis without myelopathy or radiculopathy, lumbosacral region: Secondary | ICD-10-CM | POA: Diagnosis not present

## 2016-04-06 DIAGNOSIS — M961 Postlaminectomy syndrome, not elsewhere classified: Secondary | ICD-10-CM | POA: Diagnosis not present

## 2016-04-06 DIAGNOSIS — G8929 Other chronic pain: Secondary | ICD-10-CM | POA: Diagnosis not present

## 2016-04-06 DIAGNOSIS — M4310 Spondylolisthesis, site unspecified: Secondary | ICD-10-CM | POA: Diagnosis not present

## 2016-05-15 DIAGNOSIS — E782 Mixed hyperlipidemia: Secondary | ICD-10-CM | POA: Diagnosis not present

## 2016-05-15 DIAGNOSIS — E291 Testicular hypofunction: Secondary | ICD-10-CM | POA: Diagnosis not present

## 2016-05-15 DIAGNOSIS — E119 Type 2 diabetes mellitus without complications: Secondary | ICD-10-CM | POA: Diagnosis not present

## 2016-05-15 DIAGNOSIS — D509 Iron deficiency anemia, unspecified: Secondary | ICD-10-CM | POA: Diagnosis not present

## 2016-05-17 DIAGNOSIS — R5382 Chronic fatigue, unspecified: Secondary | ICD-10-CM | POA: Diagnosis not present

## 2016-05-17 DIAGNOSIS — G894 Chronic pain syndrome: Secondary | ICD-10-CM | POA: Diagnosis not present

## 2016-05-17 DIAGNOSIS — E291 Testicular hypofunction: Secondary | ICD-10-CM | POA: Diagnosis not present

## 2016-05-17 DIAGNOSIS — N529 Male erectile dysfunction, unspecified: Secondary | ICD-10-CM | POA: Diagnosis not present

## 2016-05-17 DIAGNOSIS — E782 Mixed hyperlipidemia: Secondary | ICD-10-CM | POA: Diagnosis not present

## 2016-05-17 DIAGNOSIS — G3184 Mild cognitive impairment, so stated: Secondary | ICD-10-CM | POA: Diagnosis not present

## 2016-05-17 DIAGNOSIS — E119 Type 2 diabetes mellitus without complications: Secondary | ICD-10-CM | POA: Diagnosis not present

## 2016-05-17 DIAGNOSIS — I10 Essential (primary) hypertension: Secondary | ICD-10-CM | POA: Diagnosis not present

## 2016-05-17 DIAGNOSIS — R251 Tremor, unspecified: Secondary | ICD-10-CM | POA: Diagnosis not present

## 2016-05-19 ENCOUNTER — Other Ambulatory Visit: Payer: Self-pay

## 2016-06-06 ENCOUNTER — Ambulatory Visit (INDEPENDENT_AMBULATORY_CARE_PROVIDER_SITE_OTHER): Payer: Medicare Other | Admitting: Gastroenterology

## 2016-06-06 ENCOUNTER — Encounter: Payer: Self-pay | Admitting: Gastroenterology

## 2016-06-06 ENCOUNTER — Other Ambulatory Visit: Payer: Self-pay

## 2016-06-06 VITALS — BP 103/57 | HR 58 | Temp 98.0°F | Ht 67.0 in | Wt 208.4 lb

## 2016-06-06 DIAGNOSIS — R1010 Upper abdominal pain, unspecified: Secondary | ICD-10-CM | POA: Diagnosis not present

## 2016-06-06 DIAGNOSIS — Z85038 Personal history of other malignant neoplasm of large intestine: Secondary | ICD-10-CM | POA: Diagnosis not present

## 2016-06-06 DIAGNOSIS — R112 Nausea with vomiting, unspecified: Secondary | ICD-10-CM

## 2016-06-06 DIAGNOSIS — R1011 Right upper quadrant pain: Principal | ICD-10-CM

## 2016-06-06 DIAGNOSIS — G8929 Other chronic pain: Secondary | ICD-10-CM

## 2016-06-06 MED ORDER — LINACLOTIDE 145 MCG PO CAPS: 145.0000 ug | ORAL_CAPSULE | Freq: Every day | ORAL | 5 refills | Status: DC

## 2016-06-06 NOTE — Progress Notes (Addendum)
REVIEWED-NO ADDITIONAL RECOMMENDATIONS.Marland Kitchen Primary Care Physician:  Wende Neighbors, MD  Primary Gastroenterologist:    Chief Complaint  Patient presents with  . Colonoscopy    HPI:  John Molina is a 75 y.o. male here schedule surveillance colonoscopy. He had history of colon cancer (Stage III) back around 1999. S/P resection/chemotherapy. Last colonoscopy in 04/2013, tubulovillous adenoma >1cm. Next TCS planned for 04/2016.   Patient has chronic constipation managed with Linzess as needed. Rarely takes. BM QOD. Denies straining. No brbpr or melena. Complains of one year h/o intermittent nausea with rare vomiting. Takes Alka-seltzer with relief. No heartburn or dysphagia. Complains of upper abdominal pain almost daily, wakes up with hit, usually associated with nausea.    Current Outpatient Prescriptions  Medication Sig Dispense Refill  . amphetamine-dextroamphetamine (ADDERALL) 10 MG tablet Take 10 mg by mouth daily with breakfast.    . citalopram (CELEXA) 40 MG tablet Take 40 mg by mouth daily.    . cloNIDine (CATAPRES) 0.1 MG tablet 1 tab po q 8 hrs for 6 doses, then 1 tab po q am and qhs for 4 doses, then 1 tab q am for 2 doses. (Patient taking differently: Take 0.2 mg by mouth 2 (two) times daily. 1 tab po q 8 hrs for 6 doses, then 1 tab po q am and qhs for 4 doses, then 1 tab q am for 2 doses.) 13 tablet 0  . HYDROmorphone (DILAUDID) 8 MG tablet Take 8 mg by mouth every 4 (four) hours as needed for severe pain.    Marland Kitchen levothyroxine (SYNTHROID, LEVOTHROID) 25 MCG tablet Take 0.5 mcg by mouth daily.     Marland Kitchen linaclotide (LINZESS) 145 MCG CAPS capsule Take 145 mcg by mouth daily before breakfast.    . losartan (COZAAR) 100 MG tablet Take 100 mg by mouth daily.    Marland Kitchen oxymorphone (OPANA ER) 30 MG 12 hr tablet Take 30 mg by mouth every 12 (twelve) hours.    . propranolol (INDERAL) 40 MG tablet Take 40 mg by mouth 2 (two) times daily.    . rosuvastatin (CRESTOR) 5 MG tablet Take 5 mg by mouth daily.     . sodium-potassium bicarbonate (ALKA-SELTZER GOLD) TBEF dissolvable tablet Take 1 tablet by mouth daily as needed.    . testosterone (ANDROGEL) 50 MG/5GM (1%) GEL Place 5 g onto the skin daily.     No current facility-administered medications for this visit.     Allergies as of 06/06/2016 - Review Complete 06/06/2016  Allergen Reaction Noted  . Morphine and related  12/24/2013    Past Medical History:  Diagnosis Date  . Anemia   . Arthritis   . Chronic back pain    pain medication for 20 years  . Colon cancer ([place])   . Depression   . High triglycerides   . Hyperlipidemia   . Hypertension   . Neuromuscular disorder ([facility])    tremors in both arms and legs  . Sleep apnea    does not use CPAP  . Tremor     Past Surgical History:  Procedure Laterality Date  . ANTERIOR LATERAL LUMBAR FUSION WITH PERCUTANEOUS SCREW 1 LEVEL Left 12/29/2013   Procedure: LUMBAR THREE TO LUMBAR FOUR ANTERIOR LATERAL LUMBAR FUSION WITH PERCUTANEOUS SCREW 1 LEVEL;  Surgeon: Charlie Pitter, MD;  Location: [facility];  Service: Neurosurgery;  Laterality: Left;  . APPENDECTOMY    . BACK SURGERY    . CATARACT EXTRACTION     bilateral  . COLON  RESECTION  08/1998  . COLONOSCOPY N/A 05/09/2013   Procedure: COLONOSCOPY;  Surgeon: Danie Binder, MD;  Location: [facility];  Service: Endoscopy;  Laterality: N/A;  10:45  . HERNIA REPAIR Bilateral   . KNEE SURGERY Left   . LUMBAR LAMINECTOMY/DECOMPRESSION MICRODISCECTOMY Bilateral 01/10/2013   Procedure: LUMBAR LAMINECTOMY/DECOMPRESSION MICRODISCECTOMY 2 LEVELS;  Surgeon: Charlie Pitter, MD;  Location: [facility];  Service: Neurosurgery;  Laterality: Bilateral;  Bilateral lumbar three-four,Lumbar four-five  . PORTACATH PLACEMENT     and removal  . SHOULDER ACROMIOPLASTY Right 11/07/2012   Procedure: SHOULDER ACROMIOPLASTY;  Surgeon: Sanjuana Kava, MD;  Location: [facility];  Service: Orthopedics;  Laterality: Right;  . SHOULDER OPEN ROTATOR CUFF REPAIR Right  11/07/2012   Procedure: ROTATOR CUFF REPAIR SHOULDER OPEN;  Surgeon: Sanjuana Kava, MD;  Location: [facility];  Service: Orthopedics;  Laterality: Right;  Right Open Rotator Cuff Repair  . VASECTOMY      Family History  Problem Relation Age of Onset  . Colon cancer Neg Hx     Social History   Social History  . Marital status: Married    Spouse name: N/A  . Number of children: 2  . Years of education: N/A   Occupational History  . 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 Smoking status: Never Smoker  . Smokeless tobacco: Never Used  . Alcohol use No     Comment: patient quit in 1988. patient drinks 5 cups of coffee weekly  . Drug use: No  . Sexual activity: Yes    Birth control/ protection: None   Other Topics Concern  . Not on file   Social History Narrative  . No narrative on file      ROS:  General: Negative for anorexia, weight loss, fever, chills, fatigue, weakness. Eyes: Negative for vision changes.  ENT: Negative for hoarseness, difficulty swallowing , nasal congestion. CV: Negative for chest pain, angina, palpitations, dyspnea on exertion, peripheral edema.  Respiratory: Negative for dyspnea at rest, dyspnea on exertion, cough, sputum, wheezing.  GI: See history of present illness. GU:  Negative for dysuria, hematuria, urinary incontinence, urinary frequency, nocturnal urination.  MS: Negative for joint pain, low back pain.  Derm: Negative for rash or itching.  Neuro: Negative for weakness, abnormal sensation, seizure, frequent headaches, memory loss, confusion.  Psych: Negative for anxiety, depression, suicidal ideation, hallucinations.  Endo: Negative for unusual weight change.  Heme: Negative for bruising or bleeding. Allergy: Negative for rash or hives.    Physical Examination:  BP (!) 103/57   Pulse (!) 58   Temp 98 F (36.7 C) (Oral)   Ht 5\' 7"  (1.702 m)   Wt 208 lb 6.4 oz (94.5 kg)   BMI 32.64 kg/m     General: Well-nourished, well-developed in no acute distress. Accompanied by wife.  Head: Normocephalic, atraumatic.   Eyes: Conjunctiva pink, no icterus. Mouth: Oropharyngeal mucosa moist and pink , no lesions erythema or exudate. Neck: Supple without thyromegaly, masses, or lymphadenopathy.  Lungs: Clear to auscultation bilaterally.  Heart: Regular rate and rhythm, no murmurs rubs or gallops.  Abdomen: Bowel sounds are normal, moderate epigastric tenderness, nondistended, no hepatosplenomegaly or masses, no abdominal bruits or    hernia , no rebound or guarding.   Rectal: deferred Extremities: No lower extremity edema. No clubbing or deformities.  Neuro: Alert and oriented x 4 , grossly normal neurologically.  Skin: Warm and dry, no rash or jaundice.  Psych: Alert and cooperative, normal mood and affect.   Imaging Studies: No results found.

## 2016-06-06 NOTE — Patient Instructions (Signed)
1. Colonoscopy with possible upper endoscopy with Dr. Oneida Alar. Please see separate instructions. 2. Linzess as needed for constipation. New prescription sent to pharmacy.

## 2016-06-10 NOTE — Assessment & Plan Note (Signed)
One year h/o upper abdominal pain, wakes up with it and associated with nausea and intermittent vomiting. Some relief of nausea with alka-seltzer. Chronic pain medication may be contributing to nausea, ?gerd vs gastroparesis. Offered possible egd with deep sedation in OR.  I have discussed the risks, alternatives, benefits with regards to but not limited to the risk of reaction to medication, bleeding, infection, perforation and the patient is agreeable to proceed. Written consent to be obtained.

## 2016-06-10 NOTE — Assessment & Plan Note (Signed)
Due for surveillance colonoscopy at this time. Plan for deep sedation in the OR given polypharmacy.  I have discussed the risks, alternatives, benefits with regards to but not limited to the risk of reaction to medication, bleeding, infection, perforation and the patient is agreeable to proceed. Written consent to be obtained.  Continue Linzess for constipation.

## 2016-06-12 NOTE — Progress Notes (Signed)
CC'D TO PCP °

## 2016-06-14 DIAGNOSIS — R5382 Chronic fatigue, unspecified: Secondary | ICD-10-CM | POA: Diagnosis not present

## 2016-06-14 DIAGNOSIS — F39 Unspecified mood [affective] disorder: Secondary | ICD-10-CM | POA: Diagnosis not present

## 2016-06-14 DIAGNOSIS — G4730 Sleep apnea, unspecified: Secondary | ICD-10-CM | POA: Diagnosis not present

## 2016-06-14 DIAGNOSIS — M545 Low back pain: Secondary | ICD-10-CM | POA: Diagnosis not present

## 2016-06-14 DIAGNOSIS — G3184 Mild cognitive impairment, so stated: Secondary | ICD-10-CM | POA: Diagnosis not present

## 2016-06-14 DIAGNOSIS — Z23 Encounter for immunization: Secondary | ICD-10-CM | POA: Diagnosis not present

## 2016-06-21 NOTE — Patient Instructions (Signed)
John Molina  06/21/2016     @PREFPERIOPPHARMACY @   Your procedure is scheduled on  06/27/2016  Report to [facility] at  915  A.M.  Call this number if you have problems the morning of surgery:  [phone]   Remember:  Do not eat food or drink liquids after midnight.  Take these medicines the morning of surgery with A SIP OF WATER  Adderall, celexa, catapress, dilaudid (or oxymorphone), synthroid, cozaar, propranolol.   Do not wear jewelry, make-up or nail polish.  Do not wear lotions, powders, or perfumes, or deoderant.  Do not shave 48 hours prior to surgery.  Men may shave face and neck.  Do not bring valuables to the hospital.  [facility] is not responsible for any belongings or valuables.  Contacts, dentures or bridgework may not be worn into surgery.  Leave your suitcase in the car.  After surgery it may be brought to your room.  For patients admitted to the hospital, discharge time will be determined by your treatment team.  Patients discharged the day of surgery will not be allowed to drive home.   Name and phone number of your driver:   family Special instructions:  Follow the diet and prep instructions given to you by Dr Nona Dell office.  Please read over the following fact sheets that you were given. Anesthesia Post-op Instructions and Care and Recovery After Surgery      Esophagogastroduodenoscopy Esophagogastroduodenoscopy (EGD) is a procedure that is used to examine the lining of the esophagus, stomach, and first part of the small intestine (duodenum). A long, flexible, lighted tube with a camera attached (endoscope) is inserted down the throat to view these organs. This procedure is done to detect problems or abnormalities, such as inflammation, bleeding, ulcers, or growths, in order to treat them. The procedure lasts 5-20 minutes. It is usually an outpatient procedure, but it may need to be performed in a hospital in emergency  cases. LET [facility] CARE PROVIDER KNOW ABOUT:  Any allergies you have.  All medicines you are taking, including vitamins, herbs, eye drops, creams, and over-the-counter medicines.  Previous problems you or members of your family have had with the use of anesthetics.  Any blood disorders you have.  Previous surgeries you have had.  Medical conditions you have. RISKS AND COMPLICATIONS Generally, this is a safe procedure. However, problems can occur and include:  Infection.  Bleeding.  Tearing (perforation) of the esophagus, stomach, or duodenum.  Difficulty breathing or not being able to breathe.  Excessive sweating.  Spasms of the larynx.  Slowed heartbeat.  Low blood pressure. BEFORE THE PROCEDURE  Do not eat or drink anything after midnight on the night before the procedure or as directed by your health care provider.  Do not take your regular medicines before the procedure if your health care provider asks you not to. Ask your health care provider about changing or stopping those medicines.  If you wear dentures, be prepared to remove them before the procedure.  Arrange for someone to drive you home after the procedure. PROCEDURE  A numbing medicine (local anesthetic) may be sprayed in your throat for comfort and to stop you from gagging or coughing.  You will have an IV tube inserted in a vein in your hand or arm. You will receive medicines and fluids through this tube.  You will be given a medicine to relax you (  sedative).  A pain reliever will be given through the IV tube.  A mouth guard may be placed in your mouth to protect your teeth and to keep you from biting on the endoscope.  You will be asked to lie on your left side.  The endoscope will be inserted down your throat and into your esophagus, stomach, and duodenum.  Air will be put through the endoscope to allow your health care provider to clearly view the lining of your esophagus.  The lining  of your esophagus, stomach, and duodenum will be examined. During the exam, your health care provider may:  Remove tissue to be examined under a microscope (biopsy) for inflammation, infection, or other medical problems.  Remove growths.  Remove objects (foreign bodies) that are stuck.  Treat any bleeding with medicines or other devices that stop tissues from bleeding (hot cautery, clipping devices).  Widen (dilate) or stretch narrowed areas of your esophagus and stomach.  The endoscope will be withdrawn. AFTER THE PROCEDURE  You will be taken to a recovery area for observation. Your blood pressure, heart rate, breathing rate, and blood oxygen level will be monitored often until the medicines you were given have worn off.  Do not eat or drink anything until the numbing medicine has worn off and your gag reflex has returned. You may choke.  Your health care provider should be able to discuss his or her findings with you. It will take longer to discuss the test results if any biopsies were taken.   This information is not intended to replace advice given to you by your health care provider. Make sure you discuss any questions you have with your health care provider.   Document Released: 01/05/2005 Document Revised: 09/25/2014 Document Reviewed: 08/07/2012 Elsevier Interactive Patient Education 2016 [employer]. Esophagogastroduodenoscopy, Care After Refer to this sheet in the next few weeks. These instructions provide you with information about caring for yourself after your procedure. Your health care provider may also give you more specific instructions. Your treatment has been planned according to current medical practices, but problems sometimes occur. Call your health care provider if you have any problems or questions after your procedure. WHAT TO EXPECT AFTER THE PROCEDURE After your procedure, it is typical to feel:  Soreness in your throat.  Pain with swallowing.  Sick to  your stomach (nauseous).  Bloated.  Dizzy.  Fatigued. HOME CARE INSTRUCTIONS  Do not eat or drink anything until the numbing medicine (local anesthetic) has worn off and your gag reflex has returned. You will know that the local anesthetic has worn off when you can swallow comfortably.  Do not drive or operate machinery until directed by your health care provider.  Take medicines only as directed by your health care provider. SEEK MEDICAL CARE IF:   You cannot stop coughing.  You are not urinating at all or less than usual. SEEK IMMEDIATE MEDICAL CARE IF:  You have difficulty swallowing.  You cannot eat or drink.  You have worsening throat or chest pain.  You have dizziness or lightheadedness or you faint.  You have nausea or vomiting.  You have chills.  You have a fever.  You have severe abdominal pain.  You have black, tarry, or bloody stools.   This information is not intended to replace advice given to you by your health care provider. Make sure you discuss any questions you have with your health care provider.   Document Released: 08/21/2012 Document Revised: 09/25/2014 Document Reviewed: 08/21/2012  Elsevier Interactive Patient Education [employer].  Colonoscopy A colonoscopy is an exam to look at the entire large intestine (colon). This exam can help find problems such as tumors, polyps, inflammation, and areas of bleeding. The exam takes about 1 hour.  LET [facility] CARE PROVIDER KNOW ABOUT:   Any allergies you have.  All medicines you are taking, including vitamins, herbs, eye drops, creams, and over-the-counter medicines.  Previous problems you or members of your family have had with the use of anesthetics.  Any blood disorders you have.  Previous surgeries you have had.  Medical conditions you have. RISKS AND COMPLICATIONS  Generally, this is a safe procedure. However, as with any procedure, complications can occur. Possible  complications include:  Bleeding.  Tearing or rupture of the colon wall.  Reaction to medicines given during the exam.  Infection (rare). BEFORE THE PROCEDURE   Ask your health care provider about changing or stopping your regular medicines.  You may be prescribed an oral bowel prep. This involves drinking a large amount of medicated liquid, starting the day before your procedure. The liquid will cause you to have multiple loose stools until your stool is almost clear or light green. This cleans out your colon in preparation for the procedure.  Do not eat or drink anything else once you have started the bowel prep, unless your health care provider tells you it is safe to do so.  Arrange for someone to drive you home after the procedure. PROCEDURE   You will be given medicine to help you relax (sedative).  You will lie on your side with your knees bent.  A long, flexible tube with a light and camera on the end (colonoscope) will be inserted through the rectum and into the colon. The camera sends video back to a computer screen as it moves through the colon. The colonoscope also releases carbon dioxide gas to inflate the colon. This helps your health care provider see the area better.  During the exam, your health care provider may take a small tissue sample (biopsy) to be examined under a microscope if any abnormalities are found.  The exam is finished when the entire colon has been viewed. AFTER THE PROCEDURE   Do not drive for 24 hours after the exam.  You may have a small amount of blood in your stool.  You may pass moderate amounts of gas and have mild abdominal cramping or bloating. This is caused by the gas used to inflate your colon during the exam.  Ask when your test results will be ready and how you will get your results. Make sure you get your test results.   This information is not intended to replace advice given to you by your health care provider. Make sure you  discuss any questions you have with your health care provider.   Document Released: 09/01/2000 Document Revised: 06/25/2013 Document Reviewed: 05/12/2013 Elsevier Interactive Patient Education 2016 [employer] Colonoscopy, Care After Refer to this sheet in the next few weeks. These instructions provide you with information on caring for yourself after your procedure. Your health care provider may also give you more specific instructions. Your treatment has been planned according to current medical practices, but problems sometimes occur. Call your health care provider if you have any problems or questions after your procedure. WHAT TO EXPECT AFTER THE PROCEDURE  After your procedure, it is typical to have the following:  A small amount of blood in your stool.  Moderate  amounts of gas and mild abdominal cramping or bloating. HOME CARE INSTRUCTIONS  Do not drive, operate machinery, or sign important documents for 24 hours.  You may shower and resume your regular physical activities, but move at a slower pace for the first 24 hours.  Take frequent rest periods for the first 24 hours.  Walk around or put a warm pack on your abdomen to help reduce abdominal cramping and bloating.  Drink enough fluids to keep your urine clear or pale yellow.  You may resume your normal diet as instructed by your health care provider. Avoid heavy or fried foods that are hard to digest.  Avoid drinking alcohol for 24 hours or as instructed by your health care provider.  Only take over-the-counter or prescription medicines as directed by your health care provider.  If a tissue sample (biopsy) was taken during your procedure:  Do not take aspirin or blood thinners for 7 days, or as instructed by your health care provider.  Do not drink alcohol for 7 days, or as instructed by your health care provider.  Eat soft foods for the first 24 hours. SEEK MEDICAL CARE IF: You have persistent spotting of blood in  your stool 2-3 days after the procedure. SEEK IMMEDIATE MEDICAL CARE IF:  You have more than a small spotting of blood in your stool.  You pass large blood clots in your stool.  Your abdomen is swollen (distended).  You have nausea or vomiting.  You have a fever.  You have increasing abdominal pain that is not relieved with medicine.   This information is not intended to replace advice given to you by your health care provider. Make sure you discuss any questions you have with your health care provider.   Document Released: 04/18/2004 Document Revised: 06/25/2013 Document Reviewed: 05/12/2013 Elsevier Interactive Patient Education 2016 [employer] Monitored anesthesia care is an anesthesia service for a medical procedure. Anesthesia is the loss of the ability to feel pain. It is produced by medicines called anesthetics. It may affect a small area of your body (local anesthesia), a large area of your body (regional anesthesia), or your entire body (general anesthesia). The need for monitored anesthesia care depends your procedure, your condition, and the potential need for regional or general anesthesia. It is often provided during procedures where:   General anesthesia may be needed if there are complications. This is because you need special care when you are under general anesthesia.   You will be under local or regional anesthesia. This is so that you are able to have higher levels of anesthesia if needed.   You will receive calming medicines (sedatives). This is especially the case if sedatives are given to put you in a semi-conscious state of relaxation (deep sedation). This is because the amount of sedative needed to produce this state can be hard to predict. Too much of a sedative can produce general anesthesia. Monitored anesthesia care is performed by one or more health care providers who have special training in all types of anesthesia. You will  need to meet with these health care providers before your procedure. During this meeting, they will ask you about your medical history. They will also give you instructions to follow. (For example, you will need to stop eating and drinking before your procedure. You may also need to stop or change medicines you are taking.) During your procedure, your health care providers will stay with you. They will:   Watch your  condition. This includes watching your blood pressure, breathing, and level of pain.   Diagnose and treat problems that occur.   Give medicines if they are needed. These may include calming medicines (sedatives) and anesthetics.   Make sure you are comfortable.  Having monitored anesthesia care does not necessarily mean that you will be under anesthesia. It does mean that your health care providers will be able to manage anesthesia if you need it or if it occurs. It also means that you will be able to have a different type of anesthesia than you are having if you need it. When your procedure is complete, your health care providers will continue to watch your condition. They will make sure any medicines wear off before you are allowed to go home.    This information is not intended to replace advice given to you by your health care provider. Make sure you discuss any questions you have with your health care provider.   Document Released: 05/31/2005 Document Revised: 09/25/2014 Document Reviewed: 10/16/2012 Elsevier Interactive Patient Education 2016 Elsevier Inc[employer] PATIENT INSTRUCTIONS POST-ANESTHESIA  IMMEDIATELY FOLLOWING SURGERY:  Do not drive or operate machinery for the first twenty four hours after surgery.  Do not make any important decisions for twenty four hours after surgery or while taking narcotic pain medications or sedatives.  If you develop intractable nausea and vomiting or a severe headache please notify your doctor immediately.  FOLLOW-UP:  Please make an  appointment with your surgeon as instructed. You do not need to follow up with anesthesia unless specifically instructed to do so.  WOUND CARE INSTRUCTIONS (if applicable):  Keep a dry clean dressing on the anesthesia/puncture wound site if there is drainage.  Once the wound has quit draining you may leave it open to air.  Generally you should leave the bandage intact for twenty four hours unless there is drainage.  If the epidural site drains for more than 36-48 hours please call the anesthesia department.  QUESTIONS?:  Please feel free to call your physician or the hospital operator if you have any questions, and they will be happy to assist you.

## 2016-06-22 ENCOUNTER — Encounter (HOSPITAL_COMMUNITY): Payer: Self-pay

## 2016-06-22 ENCOUNTER — Other Ambulatory Visit: Payer: Self-pay

## 2016-06-22 ENCOUNTER — Encounter (HOSPITAL_COMMUNITY)
Admission: RE | Admit: 2016-06-22 | Discharge: 2016-06-22 | Disposition: A | Discharge status: Home / Self Care | Payer: Medicare Other | Source: Ambulatory Visit | Attending: Gastroenterology | Admitting: Gastroenterology

## 2016-06-22 DIAGNOSIS — Z01818 Encounter for other preprocedural examination: Secondary | ICD-10-CM | POA: Diagnosis not present

## 2016-06-22 HISTORY — DX: Hypothyroidism, unspecified: E03.9

## 2016-06-22 LAB — BASIC METABOLIC PANEL
Anion gap: 4 — ABNORMAL LOW (ref 5–15)
BUN: 18 mg/dL (ref 6–20)
CALCIUM: 9.4 mg/dL (ref 8.9–10.3)
CO2: 30 mmol/L (ref 22–32)
CREATININE: 1.01 mg/dL (ref 0.61–1.24)
Chloride: 102 mmol/L (ref 101–111)
GFR calc Af Amer: >60 mL/min (ref >60)
GLUCOSE: 129 mg/dL — AB (ref 65–99)
Potassium: 4.3 mmol/L (ref 3.5–5.1)
Sodium: 136 mmol/L (ref 135–145)

## 2016-06-22 LAB — CBC WITH DIFFERENTIAL/PLATELET
BASOS ABS: 0.1 10*3/uL (ref 0.0–0.1)
Basophils Relative: 2 %
EOS PCT: 5 %
Eosinophils Absolute: 0.1 10*3/uL (ref 0.0–0.7)
HEMATOCRIT: 40.4 % (ref 39.0–52.0)
Hemoglobin: 13.9 g/dL (ref 13.0–17.0)
LYMPHS ABS: 1.3 10*3/uL (ref 0.7–4.0)
LYMPHS PCT: 46 %
MCH: 31.5 pg (ref 26.0–34.0)
MCHC: 34.4 g/dL (ref 30.0–36.0)
MCV: 91.6 fL (ref 78.0–100.0)
MONO ABS: 0.3 10*3/uL (ref 0.1–1.0)
MONOS PCT: 11 %
NEUTROS ABS: 1 10*3/uL — AB (ref 1.7–7.7)
Neutrophils Relative %: 36 %
PLATELETS: 139 10*3/uL — AB (ref 150–400)
RBC: 4.41 MIL/uL (ref 4.22–5.81)
RDW: 14 % (ref 11.5–15.5)
WBC: 2.8 10*3/uL — ABNORMAL LOW (ref 4.0–10.5)

## 2016-06-27 ENCOUNTER — Encounter (HOSPITAL_COMMUNITY)
Admission: RE | Disposition: A | Discharge status: Home / Self Care | Payer: Self-pay | Source: Ambulatory Visit | Attending: Gastroenterology

## 2016-06-27 ENCOUNTER — Ambulatory Visit (HOSPITAL_COMMUNITY): Payer: Medicare Other | Admitting: Anesthesiology

## 2016-06-27 ENCOUNTER — Ambulatory Visit (HOSPITAL_COMMUNITY)
Admission: RE | Admit: 2016-06-27 | Discharge: 2016-06-27 | Disposition: A | Discharge status: Home / Self Care | Payer: Medicare Other | Source: Ambulatory Visit | Attending: Gastroenterology | Admitting: Gastroenterology

## 2016-06-27 DIAGNOSIS — R251 Tremor, unspecified: Secondary | ICD-10-CM | POA: Insufficient documentation

## 2016-06-27 DIAGNOSIS — K317 Polyp of stomach and duodenum: Secondary | ICD-10-CM | POA: Diagnosis not present

## 2016-06-27 DIAGNOSIS — E039 Hypothyroidism, unspecified: Secondary | ICD-10-CM | POA: Insufficient documentation

## 2016-06-27 DIAGNOSIS — M1990 Unspecified osteoarthritis, unspecified site: Secondary | ICD-10-CM | POA: Insufficient documentation

## 2016-06-27 DIAGNOSIS — K648 Other hemorrhoids: Secondary | ICD-10-CM | POA: Diagnosis not present

## 2016-06-27 DIAGNOSIS — K644 Residual hemorrhoidal skin tags: Secondary | ICD-10-CM | POA: Insufficient documentation

## 2016-06-27 DIAGNOSIS — M549 Dorsalgia, unspecified: Secondary | ICD-10-CM | POA: Insufficient documentation

## 2016-06-27 DIAGNOSIS — R1010 Upper abdominal pain, unspecified: Secondary | ICD-10-CM | POA: Diagnosis not present

## 2016-06-27 DIAGNOSIS — D125 Benign neoplasm of sigmoid colon: Secondary | ICD-10-CM | POA: Diagnosis not present

## 2016-06-27 DIAGNOSIS — D122 Benign neoplasm of ascending colon: Secondary | ICD-10-CM | POA: Insufficient documentation

## 2016-06-27 DIAGNOSIS — R110 Nausea: Secondary | ICD-10-CM | POA: Diagnosis not present

## 2016-06-27 DIAGNOSIS — Z1211 Encounter for screening for malignant neoplasm of colon: Secondary | ICD-10-CM | POA: Insufficient documentation

## 2016-06-27 DIAGNOSIS — Z885 Allergy status to narcotic agent status: Secondary | ICD-10-CM | POA: Diagnosis not present

## 2016-06-27 DIAGNOSIS — D649 Anemia, unspecified: Secondary | ICD-10-CM | POA: Insufficient documentation

## 2016-06-27 DIAGNOSIS — K2970 Gastritis, unspecified, without bleeding: Secondary | ICD-10-CM

## 2016-06-27 DIAGNOSIS — K2980 Duodenitis without bleeding: Secondary | ICD-10-CM

## 2016-06-27 DIAGNOSIS — F329 Major depressive disorder, single episode, unspecified: Secondary | ICD-10-CM | POA: Insufficient documentation

## 2016-06-27 DIAGNOSIS — I10 Essential (primary) hypertension: Secondary | ICD-10-CM | POA: Diagnosis not present

## 2016-06-27 DIAGNOSIS — E781 Pure hyperglyceridemia: Secondary | ICD-10-CM | POA: Diagnosis not present

## 2016-06-27 DIAGNOSIS — R1011 Right upper quadrant pain: Secondary | ICD-10-CM

## 2016-06-27 DIAGNOSIS — G4730 Sleep apnea, unspecified: Secondary | ICD-10-CM | POA: Diagnosis not present

## 2016-06-27 DIAGNOSIS — R1013 Epigastric pain: Secondary | ICD-10-CM | POA: Diagnosis not present

## 2016-06-27 DIAGNOSIS — Z85038 Personal history of other malignant neoplasm of large intestine: Secondary | ICD-10-CM | POA: Insufficient documentation

## 2016-06-27 DIAGNOSIS — K311 Adult hypertrophic pyloric stenosis: Secondary | ICD-10-CM | POA: Insufficient documentation

## 2016-06-27 DIAGNOSIS — B1920 Unspecified viral hepatitis C without hepatic coma: Secondary | ICD-10-CM | POA: Diagnosis not present

## 2016-06-27 DIAGNOSIS — G8929 Other chronic pain: Secondary | ICD-10-CM | POA: Insufficient documentation

## 2016-06-27 HISTORY — PX: POLYPECTOMY: SHX5525

## 2016-06-27 HISTORY — PX: ESOPHAGOGASTRODUODENOSCOPY (EGD) WITH PROPOFOL: SHX5813

## 2016-06-27 HISTORY — PX: COLONOSCOPY WITH PROPOFOL: SHX5780

## 2016-06-27 HISTORY — PX: BALLOON DILATION: SHX5330

## 2016-06-27 SURGERY — COLONOSCOPY WITH PROPOFOL
Anesthesia: Monitor Anesthesia Care

## 2016-06-27 MED ORDER — GLYCOPYRROLATE 0.2 MG/ML IJ SOLN
INTRAMUSCULAR | Status: DC | PRN
  Administered 2016-06-27: 0.2 mg via INTRAVENOUS

## 2016-06-27 MED ORDER — MIDAZOLAM HCL 2 MG/2ML IJ SOLN
1.0000 mg | INTRAMUSCULAR | Status: DC | PRN
  Administered 2016-06-27 (×2): 2 mg via INTRAVENOUS
  Filled 2016-06-27: qty 2

## 2016-06-27 MED ORDER — HYDROMORPHONE HCL 1 MG/ML IJ SOLN: 0.2500 mg | INTRAMUSCULAR | Status: DC | PRN

## 2016-06-27 MED ORDER — LIDOCAINE VISCOUS 2 % MT SOLN
15.0000 mL | Freq: Once | OROMUCOSAL | Status: AC
  Administered 2016-06-27: 6 mL via OROMUCOSAL

## 2016-06-27 MED ORDER — FENTANYL CITRATE (PF) 100 MCG/2ML IJ SOLN
INTRAMUSCULAR | Status: AC
  Filled 2016-06-27: qty 2

## 2016-06-27 MED ORDER — MIDAZOLAM HCL 2 MG/2ML IJ SOLN
INTRAMUSCULAR | Status: AC
  Filled 2016-06-27: qty 2

## 2016-06-27 MED ORDER — CHLORHEXIDINE GLUCONATE CLOTH 2 % EX PADS: 6.0000 | MEDICATED_PAD | Freq: Once | CUTANEOUS | Status: DC

## 2016-06-27 MED ORDER — ATROPINE SULFATE 0.4 MG/ML IJ SOLN
INTRAMUSCULAR | Status: AC
  Filled 2016-06-27: qty 1

## 2016-06-27 MED ORDER — LIDOCAINE HCL (CARDIAC) 10 MG/ML IV SOLN
INTRAVENOUS | Status: DC | PRN
  Administered 2016-06-27: 20 mg via INTRAVENOUS

## 2016-06-27 MED ORDER — MIDAZOLAM HCL 5 MG/5ML IJ SOLN
INTRAMUSCULAR | Status: DC | PRN
  Administered 2016-06-27 (×2): 0.5 mg via INTRAVENOUS

## 2016-06-27 MED ORDER — PROPOFOL 500 MG/50ML IV EMUL
INTRAVENOUS | Status: DC | PRN
  Administered 2016-06-27: 25 ug/kg/min via INTRAVENOUS
  Administered 2016-06-27: 35 ug/kg/min via INTRAVENOUS

## 2016-06-27 MED ORDER — GLYCOPYRROLATE 0.2 MG/ML IJ SOLN
INTRAMUSCULAR | Status: AC
  Filled 2016-06-27: qty 1

## 2016-06-27 MED ORDER — FENTANYL CITRATE (PF) 100 MCG/2ML IJ SOLN
25.0000 ug | INTRAMUSCULAR | Status: DC | PRN
  Administered 2016-06-27: 25 ug via INTRAVENOUS

## 2016-06-27 MED ORDER — LIDOCAINE VISCOUS 2 % MT SOLN
OROMUCOSAL | Status: AC
  Filled 2016-06-27: qty 15

## 2016-06-27 MED ORDER — PROPOFOL 10 MG/ML IV BOLUS
INTRAVENOUS | Status: DC | PRN
  Administered 2016-06-27: 10 mg via INTRAVENOUS

## 2016-06-27 MED ORDER — LACTATED RINGERS IV SOLN
INTRAVENOUS | Status: DC
  Administered 2016-06-27 (×2): via INTRAVENOUS

## 2016-06-27 MED ORDER — OMEPRAZOLE 20 MG PO CPDR: DELAYED_RELEASE_CAPSULE | ORAL | 3 refills | Status: DC

## 2016-06-27 NOTE — Anesthesia Procedure Notes (Signed)
Procedure Name: MAC Date/Time: 06/27/2016 10:11 AM Performed by: Vista Deck Pre-anesthesia Checklist: Patient identified, Emergency Drugs available, Suction available, Timeout performed and Patient being monitored Patient Re-evaluated:Patient Re-evaluated prior to inductionOxygen Delivery Method: Non-rebreather mask

## 2016-06-27 NOTE — Transfer of Care (Signed)
Immediate Anesthesia Transfer of Care Note  Patient: John Molina  Procedure(s) Performed: Procedure(s) with comments: COLONOSCOPY WITH PROPOFOL (N/A) - 1030 ESOPHAGOGASTRODUODENOSCOPY (EGD) WITH PROPOFOL (N/A) POLYPECTOMY - Ascending colon polyp, sigmoid colon polyp  Patient Location: PACU  Anesthesia Type:MAC  Level of Consciousness: awake and patient cooperative  Airway & Oxygen Therapy: Patient Spontanous Breathing and Patient connected to nasal cannula oxygen  Post-op Assessment: Report given to RN  Post vital signs: Reviewed and stable  Last Vitals:  Vitals:   06/27/16 0930 06/27/16 0935  BP: (!) 85/47 (!) 86/51  Resp: 15 18  Temp:      Last Pain:  Vitals:   06/27/16 0833  TempSrc: Oral      Patients Stated Pain Goal: 5 ([id number])  Complications: No apparent anesthesia complications

## 2016-06-27 NOTE — Anesthesia Preprocedure Evaluation (Signed)
Anesthesia Evaluation  Patient identified by MRN, date of birth, ID band Patient awake    Reviewed: Allergy & Precautions, NPO status , Patient's Chart, lab work & pertinent test results  Airway Mallampati: III  TM Distance: <3 FB   Mouth opening: Limited Mouth Opening  Dental  (+) Edentulous Upper   Pulmonary sleep apnea and Continuous Positive Airway Pressure Ventilation ,    breath sounds clear to auscultation       Cardiovascular hypertension, Pt. on medications  Rhythm:Regular Rate:Normal     Neuro/Psych PSYCHIATRIC DISORDERS Depression  Neuromuscular disease (tremors)    GI/Hepatic (+) Hepatitis -, C  Endo/Other  Hypothyroidism   Renal/GU      Musculoskeletal  (+) Arthritis  (chronic LBP),   Abdominal   Peds  Hematology  (+) anemia ,   Anesthesia Other Findings   Reproductive/Obstetrics                             Anesthesia Physical Anesthesia Plan  ASA: III  Anesthesia Plan: MAC   Post-op Pain Management:    Induction: Intravenous  Airway Management Planned: Simple Face Mask  Additional Equipment:   Intra-op Plan:   Post-operative Plan:   Informed Consent: I have reviewed the patients History and Physical, chart, labs and discussed the procedure including the risks, benefits and alternatives for the proposed anesthesia with the patient or authorized representative who has indicated his/her understanding and acceptance.     Plan Discussed with:   Anesthesia Plan Comments:         Anesthesia Quick Evaluation

## 2016-06-27 NOTE — Op Note (Signed)
[facility] Patient Name: John Molina Procedure Date: 06/27/2016 10:15 AM MRN: [id number] Date of Birth: 04/12/1941 Attending MD: Barney Drain , MD CSN: [id number] Age: 75 Admit Type: Outpatient Procedure:                Colonoscopy WITH COLD FORCEPS/SNARE CAUTERY                            POLYPECTOMY Indications:              High risk colon cancer surveillance: Personal                            history of colon cancer Providers:                Barney Drain, MD, Janeece Riggers, RN, Charlyne Petrin, RN, Purcell Nails. [place], Technician Referring MD:             Delphina Cahill, MD Medicines:                Propofol per Anesthesia Complications:            No immediate complications. Estimated Blood Loss:     Estimated blood loss was minimal. Procedure:                Pre-Anesthesia Assessment:                           - Prior to the procedure, a History and Physical                            was performed, and patient medications and                            allergies were reviewed. The patient's tolerance of                            previous anesthesia was also reviewed. The risks                            and benefits of the procedure and the sedation                            options and risks were discussed with the patient.                            All questions were answered, and informed consent                            was obtained. Prior Anticoagulants: The patient has                            taken no previous anticoagulant or antiplatelet  agents. ASA Grade Assessment: II - A patient with                            mild systemic disease. After reviewing the risks                            and benefits, the patient was deemed in                            satisfactory condition to undergo the procedure.                            After obtaining informed consent, the colonoscope                            was  passed under direct vision. Throughout the                            procedure, the patient's blood pressure, pulse, and                            oxygen saturations were monitored continuously. The                            [id number]) scope was introduced through                            the anus and advanced to the the cecum, identified                            by appendiceal orifice and ileocecal valve. The                            ileocecal valve, appendiceal orifice, and rectum                            were photographed. The colonoscopy was performed                            without difficulty. The patient tolerated the                            procedure well. The quality of the bowel                            preparation was fair INITIALLY THEN GOOD WITH                            IRRIGATION/ASPIRATION. Scope In: 10:28:04 AM Scope Out: 10:53:00 AM Scope Withdrawal Time: 0 hours 20 minutes 48 seconds  Total Procedure Duration: 0 hours 24 minutes 56 seconds  Findings:      The digital rectal exam findings include non-thrombosed external       hemorrhoids.      A 4 mm  polyp was found in the proximal ascending colon. The polyp was       sessile. The polyp was removed with a cold biopsy forceps. Resection and       retrieval were complete.      A 8 mm polyp was found in the sigmoid colon. The polyp was sessile. The       polyp was removed with a hot snare. Resection and retrieval were       complete.      Non-bleeding internal hemorrhoids were found. The hemorrhoids were large.      -NORMAL ANASTOMOSIS ~10 CM FROM THE ANAL VERGE. Impression:               - Preparation of the colon was fair.                           - Non-thrombosed external hemorrhoids found on                            digital rectal exam.                           - One 4 mm polyp in the proximal ascending colon,                            removed with a cold biopsy forceps. Resected and                             retrieved.                           - One 8 mm polyp in the sigmoid colon, removed with                            a hot snare. Resected and retrieved.                           - Non-bleeding internal hemorrhoids. Moderate Sedation:      Per Anesthesia Care Recommendation:           - High fiber diet.                           - Continue present medications.                           - Await pathology results.                           - Repeat colonoscopy in 5 years for surveillance.                           - Return to GI office in 3 months.                           - Patient has a contact number available for  emergencies. The signs and symptoms of potential                            delayed complications were discussed with the                            patient. Return to normal activities tomorrow.                            Written discharge instructions were provided to the                            patient. Procedure Code(s):        --- Professional ---                           [phone], Colonoscopy, flexible; with removal of                            tumor(s), polyp(s), or other lesion(s) by snare                            technique                           45380, 64, Colonoscopy, flexible; with biopsy,                            single or multiple Diagnosis Code(s):        --- Professional ---                           [id number], Personal history of other malignant                            neoplasm of large intestine                           K64.4, Residual hemorrhoidal skin tags                           K64.8, Other hemorrhoids                           D12.2, Benign neoplasm of ascending colon                           D12.5, Benign neoplasm of sigmoid colon CPT copyright 2016 American Medical Association. All rights reserved. The codes documented in this report are preliminary and upon coder review may  be  revised to meet current compliance requirements. Barney Drain, MD Barney Drain, MD 06/27/2016 11:48:48 AM This report has been signed electronically. Number of Addenda: 0

## 2016-06-27 NOTE — OR Nursing (Signed)
Patient complains of "soreness" in right front of upper gum. Upon examination there is a red area on gumline that appears a little swollen. Ice chips given and Dr Oneida Alar and Ellison Carwin CRNA both aware. No further orders at this time.

## 2016-06-27 NOTE — Op Note (Signed)
[facility] Patient Name: John Molina Procedure Date: 06/27/2016 10:56 AM MRN: [id number] Date of Birth: [birth year] Attending MD: Barney Drain , MD CSN: [id number] Age: 75 Admit Type: Outpatient Procedure:                Upper GI endoscopy WITH COLD FORCEPS BIOPSY AND                            PYLORIC BALLOON DILATION Indications:              Epigastric abdominal pain, Dyspepsia, Nausea Providers:                Barney Drain, MD, Janeece Riggers, RN, Charlyne Petrin, RN, [facility], Technician Referring MD:             Delphina Cahill, MD Medicines:                Propofol per Anesthesia Complications:            No immediate complications. Estimated Blood Loss:     Estimated blood loss was minimal. Procedure:                Pre-Anesthesia Assessment:                           - Prior to the procedure, a History and Physical                            was performed, and patient medications and                            allergies were reviewed. The patient's tolerance of                            previous anesthesia was also reviewed. The risks                            and benefits of the procedure and the sedation                            options and risks were discussed with the patient.                            All questions were answered, and informed consent                            was obtained. Prior Anticoagulants: The patient has                            taken no previous anticoagulant or antiplatelet                            agents. ASA Grade Assessment: II - A patient with  mild systemic disease. After reviewing the risks                            and benefits, the patient was deemed in                            satisfactory condition to undergo the procedure.                           After obtaining informed consent, the endoscope was                            passed under direct vision. Throughout  the                            procedure, the patient's blood pressure, pulse, and                            oxygen saturations were monitored continuously. The                            [id number] scope was introduced through the                            mouth, and advanced to the second part of duodenum.                            The upper GI endoscopy was accomplished without                            difficulty. The patient tolerated the procedure                            well. Scope In: 10:59:39 AM Scope Out: 11:12:11 AM Total Procedure Duration: 0 hours 12 minutes 32 seconds  Findings:      The examined esophagus was normal.      Localized mild inflammation characterized by congestion (edema),       erosions and erythema was found on the greater curvature of the stomach       and in the gastric antrum. Biopsies were taken with a cold forceps for       Helicobacter pylori testing.      A benign-appearing, intrinsic mild stenosis was found at the pylorus.       This was traversed. A TTS dilator was passed through the scope. Dilation       with a 13.5 mm and a 15 mm pyloric balloon dilator was performed. The       dilation site was examined following endoscope reinsertion and showed no       perforation. Estimated blood loss was minimal.      Scattered moderate inflammation characterized by congestion (edema),       erosions and erythema was found in the duodenal bulb. Biopsies for       histology were taken with a cold forceps for evaluation of celiac       disease. Impression:               -  Normal esophagus.                           - Gastritis. Biopsied.                           - Gastric stenosis was found at the pylorus.                            Dilated.                           - Duodenitis. Biopsied. Moderate Sedation:      Per Anesthesia Care Recommendation:           - High fiber diet.                           - Use Prilosec (omeprazole) 20 mg PO  daily.                           - Await pathology results.                           - Return to my office in 3 months.                           - Patient has a contact number available for                            emergencies. The signs and symptoms of potential                            delayed complications were discussed with the                            patient. Return to normal activities tomorrow.                            Written discharge instructions were provided to the                            patient.                           - Post procedure medication instructions were                            provided to the patient. Procedure Code(s):        --- Professional ---                           [phone], Esophagogastroduodenoscopy, flexible,                            transoral; with dilation of gastric/duodenal  stricture(s) (eg, balloon, bougie)                           43239, Esophagogastroduodenoscopy, flexible,                            transoral; with biopsy, single or multiple Diagnosis Code(s):        --- Professional ---                           K29.70, Gastritis, unspecified, without bleeding                           K31.1, Adult hypertrophic pyloric stenosis                           K29.80, Duodenitis without bleeding                           R10.13, Epigastric pain                           R11.0, Nausea CPT copyright 2016 American Medical Association. All rights reserved. The codes documented in this report are preliminary and upon coder review may  be revised to meet current compliance requirements. Barney Drain, MD Barney Drain, MD 06/27/2016 11:53:55 AM This report has been signed electronically. Number of Addenda: 0

## 2016-06-27 NOTE — Anesthesia Postprocedure Evaluation (Signed)
Anesthesia Post Note  Patient: John Molina  Procedure(s) Performed: Procedure(s) (LRB): COLONOSCOPY WITH PROPOFOL (N/A) ESOPHAGOGASTRODUODENOSCOPY (EGD) WITH PROPOFOL (N/A) POLYPECTOMY BALLOON DILATION  Patient location during evaluation: PACU Anesthesia Type: MAC Level of consciousness: awake Pain management: satisfactory to patient Vital Signs Assessment: post-procedure vital signs reviewed and stable Respiratory status: spontaneous breathing Cardiovascular status: stable Anesthetic complications: no    Last Vitals:  Vitals:   06/27/16 1155 06/27/16 1156  BP: 130/65 (!) 134/57  Pulse: (!) 52 (!) 52  Resp: 18 16  Temp:      Last Pain:  Vitals:   06/27/16 1156  TempSrc:   PainSc: 3                  Lincoln Kleiner

## 2016-06-27 NOTE — H&P (Signed)
Primary Care Physician:  Wende Neighbors, MD Primary Gastroenterologist:  Dr. Oneida Alar  Pre-Procedure History & Physical: HPI:  John Molina is a 75 y.o. male here for   Past Medical History:  Diagnosis Date  . Anemia   . Arthritis   . Chronic back pain    pain medication for 20 years  . Colon cancer ([place]) 1999  . Depression   . High triglycerides   . Hyperlipidemia   . Hypertension   . Hypothyroidism   . Neuromuscular disorder (HCC)    tremors in both arms and legs  . Sleep apnea    does not use CPAP  . Tremor     Past Surgical History:  Procedure Laterality Date  . ANTERIOR LATERAL LUMBAR FUSION WITH PERCUTANEOUS SCREW 1 LEVEL Left 12/29/2013   Procedure: LUMBAR THREE TO LUMBAR FOUR ANTERIOR LATERAL LUMBAR FUSION WITH PERCUTANEOUS SCREW 1 LEVEL;  Surgeon: Charlie Pitter, MD;  Location: [place] [facility];  Service: Neurosurgery;  Laterality: Left;  . APPENDECTOMY    . BACK SURGERY    . CATARACT EXTRACTION     bilateral  . COLON RESECTION  08/1998  . COLONOSCOPY N/A 05/09/2013   Procedure: COLONOSCOPY;  Surgeon: Danie Binder, MD;  Location: [facility];  Service: Endoscopy;  Laterality: N/A;  10:45  . HERNIA REPAIR Bilateral   . KNEE SURGERY Left   . LUMBAR LAMINECTOMY/DECOMPRESSION MICRODISCECTOMY Bilateral 01/10/2013   Procedure: LUMBAR LAMINECTOMY/DECOMPRESSION MICRODISCECTOMY 2 LEVELS;  Surgeon: Charlie Pitter, MD;  Location: [place] [facility];  Service: Neurosurgery;  Laterality: Bilateral;  Bilateral lumbar three-four,Lumbar four-five  . PORTACATH PLACEMENT     and removal  . SHOULDER ACROMIOPLASTY Right 11/07/2012   Procedure: SHOULDER ACROMIOPLASTY;  Surgeon: Sanjuana Kava, MD;  Location: [facility];  Service: Orthopedics;  Laterality: Right;  . SHOULDER OPEN ROTATOR CUFF REPAIR Right 11/07/2012   Procedure: ROTATOR CUFF REPAIR SHOULDER OPEN;  Surgeon: Sanjuana Kava, MD;  Location: [facility];  Service: Orthopedics;  Laterality: Right;  Right Open Rotator Cuff Repair  . VASECTOMY       Prior to Admission medications   Medication Sig Start Date End Date Taking? Authorizing Provider  amphetamine-dextroamphetamine (ADDERALL) 30 MG tablet Take 30 mg by mouth daily with breakfast.    Yes Historical Provider, MD  citalopram (CELEXA) 40 MG tablet Take 40 mg by mouth daily.   Yes Historical Provider, MD  cloNIDine (CATAPRES) 0.1 MG tablet 1 tab po q 8 hrs for 6 doses, then 1 tab po q am and qhs for 4 doses, then 1 tab q am for 2 doses. Patient taking differently: Take 0.2 mg by mouth 2 (two) times daily. 1 tab po q 8 hrs for 6 doses, then 1 tab po q am and qhs for 4 doses, then 1 tab q am for 2 doses. 05/30/14  Yes Davonna Belling, MD  HYDROmorphone (DILAUDID) 8 MG tablet Take 8 mg by mouth 3 (three) times daily.    Yes Historical Provider, MD  levothyroxine (SYNTHROID, LEVOTHROID) 25 MCG tablet Take 0.5 mcg by mouth daily.  05/05/14  Yes Historical Provider, MD  linaclotide Rolan Lipa) 145 MCG CAPS capsule Take 1 capsule (145 mcg total) by mouth daily before breakfast. 06/06/16  Yes Mahala Menghini, PA-C  losartan (COZAAR) 100 MG tablet Take 100 mg by mouth daily.   Yes Historical Provider, MD  oxymorphone (OPANA ER) 30 MG 12 hr tablet Take 30 mg by mouth daily.    Yes Historical Provider, MD  propranolol (INDERAL) 40 MG tablet Take 40 mg by mouth 2 (two) times daily.   Yes Historical Provider, MD  rosuvastatin (CRESTOR) 5 MG tablet Take 5 mg by mouth daily.   Yes Historical Provider, MD  sodium-potassium bicarbonate (ALKA-SELTZER GOLD) TBEF dissolvable tablet Take 1 tablet by mouth daily as needed.   Yes Historical Provider, MD  testosterone (ANDROGEL) 50 MG/5GM (1%) GEL Place 5 g onto the skin daily.   Yes Historical Provider, MD    Allergies as of 06/06/2016 - Review Complete 06/06/2016  Allergen Reaction Noted  . Morphine and related  12/24/2013    Family History  Problem Relation Age of Onset  . Colon cancer Neg Hx     Social History   Social History  . Marital status:  Married    Spouse name: N/A  . Number of children: 2  . Years of education: N/A   Occupational History  . 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 Smoking status: Never Smoker  . Smokeless tobacco: Never Used  . Alcohol use No     Comment: patient quit in 1988. patient drinks 5 cups of coffee weekly  . Drug use: No  . Sexual activity: Yes    Birth control/ protection: None   Other Topics Concern  . Not on file   Social History Narrative  . No narrative on file    Review of Systems: See HPI, otherwise negative ROS   Physical Exam: BP (!) 86/51   Temp 98.7 F (37.1 C) (Oral)   Resp 18   SpO2 99%  General:   Alert,  pleasant and cooperative in NAD Head:  Normocephalic and atraumatic. Neck:  Supple; Lungs:  Clear throughout to auscultation.    Heart:  Regular rate and rhythm. Abdomen:  Soft, nontender and nondistended. Normal bowel sounds, without guarding, and without rebound.   Neurologic:  Alert and  oriented x4;  grossly normal neurologically.  Impression/Plan:     DYSPEPSIA/personal history of colon cancer.  PLAN:  TCS/EGD TODAY. DISCUSSED PROCEDURE, BENEFITS, & RISKS: < 1% chance of medication reaction, bleeding, perforation, or rupture of spleen/liver.

## 2016-06-27 NOTE — Discharge Instructions (Signed)
I STRETCHED your pylorus. YOU LIKELY HAD PYLORIC STENOSIS, WHICH MEANS THE FOOD WOULD NOT EMPTY OUT OF YOUR STOMACH AND CAUSE UPPER ABDOMINAL PAIN AND NAUSEA. You have mild gastritis. I biopsied your stomach.   DRINK WATER TO KEEP YOUR URINE LIGHT YELLOW.  FOLLOW A HIGH FIBER DIET. AVOID ITEMS THAT CAUSE BLOATING & GAS. SEE INFO BELOW.  START CONTINUE OMEPRAZOLE.  TAKE 30 MINUTES PRIOR TO YOUR FIRST MEAL.  YOUR BIOPSY RESULTS WILL BE AVAILABLE IN MY CHART AFTER OCT 13 AND MY OFFICE WILL CONTACT YOU IN 10-14 DAYS WITH YOUR RESULTS.    FOLLOW UP IN 3 MOS.  January 10th @ 1:30 UPPER ENDOSCOPY AFTER CARE Read the instructions outlined below and refer to this sheet in the next week. These discharge instructions provide you with general information on caring for yourself after you leave the hospital. While your treatment has been planned according to the most current medical practices available, unavoidable complications occasionally occur. If you have any problems or questions after discharge, call DR. FIELDS, [phone].  ACTIVITY  You may resume your regular activity, but move at a slower pace for the next 24 hours.   Take frequent rest periods for the next 24 hours.   Walking will help get rid of the air and reduce the bloated feeling in your belly (abdomen).   No driving for 24 hours (because of the medicine (anesthesia) used during the test).   You may shower.   Do not sign any important legal documents or operate any machinery for 24 hours (because of the anesthesia used during the test).    NUTRITION  Drink plenty of fluids.   You may resume your normal diet as instructed by your doctor.   Begin with a light meal and progress to your normal diet. Heavy or fried foods are harder to digest and may make you feel sick to your stomach (nauseated).   Avoid alcoholic beverages for 24 hours or as instructed.    MEDICATIONS  You may resume your normal medications.   WHAT  YOU CAN EXPECT TODAY  Some feelings of bloating in the abdomen.   Passage of more gas than usual.    IF YOU HAD A BIOPSY TAKEN DURING THE UPPER ENDOSCOPY:  No aspirin products for 3 days.   Eat a soft diet IF YOU HAVE NAUSEA, BLOATING, ABDOMINAL PAIN, OR VOMITING.    FINDING OUT THE RESULTS OF YOUR TEST Not all test results are available during your visit. DR. Oneida Alar WILL CALL YOU WITHIN 14 DAYS OF YOUR PROCEDUE WITH YOUR RESULTS. Do not assume everything is normal if you have not heard from DR. FIELDS IN ONE WEEK, CALL HER OFFICE AT [phone].  SEEK IMMEDIATE MEDICAL ATTENTION AND CALL THE OFFICE: [phone] IF:  You have more than a spotting of blood in your stool.   Your belly is swollen (abdominal distention).   You are nauseated or vomiting.   You have a temperature over 101F.   You have abdominal pain or discomfort that is severe or gets worse throughout the day.  Gastritis  Gastritis is an inflammation (the body's way of reacting to injury and/or infection) of the stomach. It is often caused by viral or bacterial (germ) infections. It can also be caused BY ASPIRIN, ALKA SELTZER, BC/GOODY POWDER'S, (IBUPROFEN) MOTRIN, OR ALEVE (NAPROXEN), chemicals (including alcohol), SPICY FOODS, and medications. This illness may be associated with generalized malaise (feeling tired, not well), UPPER ABDOMINAL STOMACH cramps, and fever. One common bacterial cause of  gastritis is an organism known as H. Pylori. This can be treated with antibiotics.

## 2016-06-28 DIAGNOSIS — G8929 Other chronic pain: Secondary | ICD-10-CM | POA: Diagnosis not present

## 2016-06-28 DIAGNOSIS — Z6831 Body mass index (BMI) 31.0-31.9, adult: Secondary | ICD-10-CM | POA: Diagnosis not present

## 2016-06-28 DIAGNOSIS — M4310 Spondylolisthesis, site unspecified: Secondary | ICD-10-CM | POA: Diagnosis not present

## 2016-06-28 DIAGNOSIS — I10 Essential (primary) hypertension: Secondary | ICD-10-CM | POA: Diagnosis not present

## 2016-06-28 DIAGNOSIS — M47817 Spondylosis without myelopathy or radiculopathy, lumbosacral region: Secondary | ICD-10-CM | POA: Diagnosis not present

## 2016-06-30 ENCOUNTER — Encounter (HOSPITAL_COMMUNITY): Payer: Self-pay | Admitting: Gastroenterology

## 2016-07-12 DIAGNOSIS — G894 Chronic pain syndrome: Secondary | ICD-10-CM | POA: Diagnosis not present

## 2016-07-12 DIAGNOSIS — F5101 Primary insomnia: Secondary | ICD-10-CM | POA: Diagnosis not present

## 2016-07-12 DIAGNOSIS — R5382 Chronic fatigue, unspecified: Secondary | ICD-10-CM | POA: Diagnosis not present

## 2016-07-12 DIAGNOSIS — G3184 Mild cognitive impairment, so stated: Secondary | ICD-10-CM | POA: Diagnosis not present

## 2016-07-12 DIAGNOSIS — I10 Essential (primary) hypertension: Secondary | ICD-10-CM | POA: Diagnosis not present

## 2016-07-14 ENCOUNTER — Other Ambulatory Visit: Payer: Self-pay

## 2016-07-14 ENCOUNTER — Telehealth: Payer: Self-pay | Admitting: Gastroenterology

## 2016-07-14 DIAGNOSIS — R7989 Other specified abnormal findings of blood chemistry: Secondary | ICD-10-CM

## 2016-07-14 NOTE — Telephone Encounter (Signed)
LMOM for a return call. Orders entered for repeat CBC.

## 2016-07-14 NOTE — Telephone Encounter (Signed)
PLEASE CALL PT. HIS WHITE BLOOD CELL AND PLATELET COUNT ARE LOW. WE SHOULD REPEAT THE TEST AND IF IT IS STILL LOW THEN I WILL REFER HIM TO THE BLOOD SPECIALIST.

## 2016-07-18 NOTE — Telephone Encounter (Signed)
LMOM that I have a very important message from Dr. Oneida Alar to please return my call. Also, mailing a letter to call.

## 2016-07-25 DIAGNOSIS — R7989 Other specified abnormal findings of blood chemistry: Secondary | ICD-10-CM | POA: Diagnosis not present

## 2016-07-25 LAB — CBC WITH DIFFERENTIAL/PLATELET
BASOS ABS: 42 {cells}/uL (ref 0–200)
BASOS PCT: 1 %
EOS ABS: 126 {cells}/uL (ref 15–500)
Eosinophils Relative: 3 %
HCT: 44.5 % (ref 38.5–50.0)
HEMOGLOBIN: 14.9 g/dL (ref 13.2–17.1)
LYMPHS ABS: 1638 {cells}/uL (ref 850–3900)
Lymphocytes Relative: 39 %
MCH: 31 pg (ref 27.0–33.0)
MCHC: 33.5 g/dL (ref 32.0–36.0)
MCV: 92.7 fL (ref 80.0–100.0)
MONO ABS: 294 {cells}/uL (ref 200–950)
MPV: 10.7 fL (ref 7.5–12.5)
Monocytes Relative: 7 %
NEUTROS ABS: 2100 {cells}/uL (ref 1500–7800)
Neutrophils Relative %: 50 %
Platelets: 216 10*3/uL (ref 140–400)
RBC: 4.8 MIL/uL (ref 4.20–5.80)
RDW: 13.8 % (ref 11.0–15.0)
WBC: 4.2 10*3/uL (ref 3.8–10.8)

## 2016-07-25 NOTE — Telephone Encounter (Signed)
Pt called and he is aware of results and will go have the blood work redone

## 2016-09-27 ENCOUNTER — Encounter: Payer: Self-pay | Admitting: Gastroenterology

## 2016-09-27 ENCOUNTER — Ambulatory Visit (INDEPENDENT_AMBULATORY_CARE_PROVIDER_SITE_OTHER): Payer: Medicare HMO | Admitting: Gastroenterology

## 2016-09-27 VITALS — BP 137/75 | HR 63 | Temp 97.9°F | Ht 67.0 in | Wt 202.0 lb

## 2016-09-27 DIAGNOSIS — Z85038 Personal history of other malignant neoplasm of large intestine: Secondary | ICD-10-CM | POA: Diagnosis not present

## 2016-09-27 NOTE — Progress Notes (Signed)
cc'ed to pcp °

## 2016-09-27 NOTE — Assessment & Plan Note (Signed)
Doing well, with most recent colonoscopy noting 2 simple adenomas. Surveillance planned in 2022. Discussed signs/symptoms that would necessitate evaluation. No dysphagia or concerning upper GI symptoms. Most recent episodes of abdominal discomfort seem to correlate with eating cinnamon rolls. Contact us if any further issues despite avoiding cinnamon rolls. Return in 1 year otherwise.  As of note, history of fatty liver noted on CT in 2014. Obtain most recent labs from PCP if possible.

## 2016-09-27 NOTE — Progress Notes (Signed)
Referring Provider: Celene Squibb, MD Primary Care Physician:  Wende Neighbors, MD  Primary GI: Dr. Oneida Alar   Chief Complaint  Patient presents with  . Nausea  . Abdominal Pain    HPI:   John Molina is a 76 y.o. male presenting today with a history of Stage III colon cancer in 1999, s/p resection and chemo. Colonoscopy recently with 2 simple adenomas and due for surveillance in 2022. EGD at time of colonoscopy noted pyloric stenosis s/p dilation, gastritis, duodenitis.   Last 2 days woke up with abdominal pain after a nap, and he believes it is related to cinnamon rolls. No dysphagia. No constipation. Only taking Linzess 145 if he feels he has a problem. Chronic narcotic therapy. Omeprazole BID.   Past Medical History:  Diagnosis Date  . Anemia   . Arthritis   . Chronic back pain    pain medication for 20 years  . Colon cancer ([place]) 1999  . Depression   . High triglycerides   . Hyperlipidemia   . Hypertension   . Hypothyroidism   . Neuromuscular disorder (HCC)    tremors in both arms and legs  . Sleep apnea    does not use CPAP  . Tremor     Past Surgical History:  Procedure Laterality Date  . ANTERIOR LATERAL LUMBAR FUSION WITH PERCUTANEOUS SCREW 1 LEVEL Left 12/29/2013   Procedure: LUMBAR THREE TO LUMBAR FOUR ANTERIOR LATERAL LUMBAR FUSION WITH PERCUTANEOUS SCREW 1 LEVEL;  Surgeon: Charlie Pitter, MD;  Location: [facility] [facility];  Service: Neurosurgery;  Laterality: Left;  . APPENDECTOMY    . BACK SURGERY    . BALLOON DILATION  06/27/2016   Procedure: BALLOON DILATION;  Surgeon: Danie Binder, MD;  [facility];  Service: Endoscopy;;  . CATARACT EXTRACTION     bilateral  . COLON RESECTION  08/1998  . COLONOSCOPY N/A 05/09/2013   Procedure: COLONOSCOPY;  Surgeon: Danie Binder, MD;  [facility];  Service: Endoscopy;  Laterality: N/A;  10:45  . COLONOSCOPY WITH PROPOFOL N/A 06/27/2016   Dr. Oneida Alar: 2 simple adenomas. Surveillance in 5 years   .  ESOPHAGOGASTRODUODENOSCOPY (EGD) WITH PROPOFOL N/A 06/27/2016   Dr. Oneida Alar: normal esophagus, gastritis, pyloric stenosis s/p dilation, duodenitis  . HERNIA REPAIR Bilateral   . KNEE SURGERY Left   . LUMBAR LAMINECTOMY/DECOMPRESSION MICRODISCECTOMY Bilateral 01/10/2013   Procedure: LUMBAR LAMINECTOMY/DECOMPRESSION MICRODISCECTOMY 2 LEVELS;  Surgeon: Charlie Pitter, MD;  Location: [place] [facility];  Service: Neurosurgery;  Laterality: Bilateral;  Bilateral lumbar three-four,Lumbar four-five  . POLYPECTOMY  06/27/2016   Procedure: POLYPECTOMY;  Surgeon: Danie Binder, MD;  [facility];  Service: Endoscopy;;  Ascending colon polyp, sigmoid colon polyp  . PORTACATH PLACEMENT     and removal  . SHOULDER ACROMIOPLASTY Right 11/07/2012   Procedure: SHOULDER ACROMIOPLASTY;  Surgeon: Sanjuana Kava, MD;  Location: [facility];  Service: Orthopedics;  Laterality: Right;  . SHOULDER OPEN ROTATOR CUFF REPAIR Right 11/07/2012   Procedure: ROTATOR CUFF REPAIR SHOULDER OPEN;  Surgeon: Sanjuana Kava, MD;  Location: [facility];  Service: Orthopedics;  Laterality: Right;  Right Open Rotator Cuff Repair  . VASECTOMY      Current Outpatient Prescriptions  Medication Sig Dispense Refill  . amphetamine-dextroamphetamine (ADDERALL) 30 MG tablet Take 30 mg by mouth daily with breakfast.     . citalopram (CELEXA) 40 MG tablet Take 40 mg by mouth daily.    . cloNIDine (CATAPRES) 0.1 MG  tablet 1 tab po q 8 hrs for 6 doses, then 1 tab po q am and qhs for 4 doses, then 1 tab q am for 2 doses. (Patient taking differently: Take 0.2 mg by mouth 2 (two) times daily. 1 tab po q 8 hrs for 6 doses, then 1 tab po q am and qhs for 4 doses, then 1 tab q am for 2 doses.) 13 tablet 0  . HYDROmorphone (DILAUDID) 8 MG tablet Take 8 mg by mouth 3 (three) times daily.     Marland Kitchen levothyroxine (SYNTHROID, LEVOTHROID) 25 MCG tablet Take 0.5 mcg by mouth daily.     Marland Kitchen linaclotide (LINZESS) 145 MCG CAPS capsule Take 1 capsule (145 mcg total) by mouth  daily before breakfast. 30 capsule 5  . losartan (COZAAR) 100 MG tablet Take 100 mg by mouth daily.    Marland Kitchen omeprazole (PRILOSEC) 20 MG capsule 1 PO 30 mins prior to breakfast 90 capsule 3  . oxymorphone (OPANA ER) 30 MG 12 hr tablet Take 30 mg by mouth daily.     . propranolol (INDERAL) 40 MG tablet Take 40 mg by mouth 2 (two) times daily.    . rosuvastatin (CRESTOR) 5 MG tablet Take 5 mg by mouth daily.    . sodium-potassium bicarbonate (ALKA-SELTZER GOLD) TBEF dissolvable tablet Take 1 tablet by mouth daily as needed.    . testosterone (ANDROGEL) 50 MG/5GM (1%) GEL Place 5 g onto the skin daily.     No current facility-administered medications for this visit.     Allergies as of 09/27/2016 - Review Complete 09/27/2016  Allergen Reaction Noted  . Morphine and related  12/24/2013    Family History  Problem Relation Age of Onset  . Colon cancer Neg Hx     Social History   Social History  . Marital status: Married    Spouse name: N/A  . Number of children: 2  . Years of education: N/A   Occupational History  . 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 Smoking status: Never Smoker  . Smokeless tobacco: Never Used  . Alcohol use No     Comment: patient quit in 1988. patient drinks 5 cups of coffee weekly  . Drug use: No  . Sexual activity: Yes    Birth control/ protection: None   Other Topics Concern  . None   Social History Narrative  . None    Review of Systems: As mentioned in HPI.   Physical Exam: BP 137/75   Pulse 63   Temp 97.9 F (36.6 C) (Oral)   Ht 5\' 7"  (1.702 m)   Wt 202 lb (91.6 kg)   BMI 31.64 kg/m  General:   Alert and oriented. No distress noted. Pleasant and cooperative.  Head:  Normocephalic and atraumatic. Eyes:  Conjuctiva clear without scleral icterus. Mouth:  Oral mucosa pink and moist. Good dentition. No lesions. Abdomen:  +BS, soft, non-tender and non-distended. No rebound or guarding. No HSM or  masses noted. Msk:  Symmetrical without gross deformities. Normal posture. Neurologic:  Alert and  oriented x4;  grossly normal neurologically. Psych:  Alert and cooperative. Normal mood and affect.

## 2016-09-27 NOTE — Patient Instructions (Signed)
We will see in 1 year.   Your next colonoscopy will be in 2022.

## 2016-09-28 DIAGNOSIS — M961 Postlaminectomy syndrome, not elsewhere classified: Secondary | ICD-10-CM | POA: Diagnosis not present

## 2016-09-28 DIAGNOSIS — M545 Low back pain: Secondary | ICD-10-CM | POA: Diagnosis not present

## 2016-09-28 DIAGNOSIS — M47817 Spondylosis without myelopathy or radiculopathy, lumbosacral region: Secondary | ICD-10-CM | POA: Diagnosis not present

## 2016-10-02 NOTE — Progress Notes (Signed)
Received outside labs from Aug 2017: Tbili 0.9, Alk Phos 49, AST 24, ALT 26.

## 2016-10-12 ENCOUNTER — Encounter: Payer: Self-pay | Admitting: Gastroenterology

## 2016-11-08 DIAGNOSIS — E119 Type 2 diabetes mellitus without complications: Secondary | ICD-10-CM | POA: Diagnosis not present

## 2016-11-08 DIAGNOSIS — E782 Mixed hyperlipidemia: Secondary | ICD-10-CM | POA: Diagnosis not present

## 2016-11-08 DIAGNOSIS — D509 Iron deficiency anemia, unspecified: Secondary | ICD-10-CM | POA: Diagnosis not present

## 2016-11-08 DIAGNOSIS — R739 Hyperglycemia, unspecified: Secondary | ICD-10-CM | POA: Diagnosis not present

## 2016-11-08 DIAGNOSIS — E291 Testicular hypofunction: Secondary | ICD-10-CM | POA: Diagnosis not present

## 2016-11-13 DIAGNOSIS — I10 Essential (primary) hypertension: Secondary | ICD-10-CM | POA: Diagnosis not present

## 2016-11-13 DIAGNOSIS — R5382 Chronic fatigue, unspecified: Secondary | ICD-10-CM | POA: Diagnosis not present

## 2016-11-13 DIAGNOSIS — E291 Testicular hypofunction: Secondary | ICD-10-CM | POA: Diagnosis not present

## 2016-11-13 DIAGNOSIS — E119 Type 2 diabetes mellitus without complications: Secondary | ICD-10-CM | POA: Diagnosis not present

## 2016-11-13 DIAGNOSIS — N529 Male erectile dysfunction, unspecified: Secondary | ICD-10-CM | POA: Diagnosis not present

## 2016-11-13 DIAGNOSIS — R69 Illness, unspecified: Secondary | ICD-10-CM | POA: Diagnosis not present

## 2016-11-13 DIAGNOSIS — G894 Chronic pain syndrome: Secondary | ICD-10-CM | POA: Diagnosis not present

## 2016-11-13 DIAGNOSIS — R251 Tremor, unspecified: Secondary | ICD-10-CM | POA: Diagnosis not present

## 2016-11-13 DIAGNOSIS — E782 Mixed hyperlipidemia: Secondary | ICD-10-CM | POA: Diagnosis not present

## 2016-11-13 DIAGNOSIS — G3184 Mild cognitive impairment, so stated: Secondary | ICD-10-CM | POA: Diagnosis not present

## 2016-12-12 DIAGNOSIS — H1033 Unspecified acute conjunctivitis, bilateral: Secondary | ICD-10-CM | POA: Diagnosis not present

## 2016-12-18 DIAGNOSIS — H04123 Dry eye syndrome of bilateral lacrimal glands: Secondary | ICD-10-CM | POA: Diagnosis not present

## 2016-12-26 DIAGNOSIS — G8929 Other chronic pain: Secondary | ICD-10-CM | POA: Diagnosis not present

## 2016-12-26 DIAGNOSIS — M47817 Spondylosis without myelopathy or radiculopathy, lumbosacral region: Secondary | ICD-10-CM | POA: Diagnosis not present

## 2016-12-26 DIAGNOSIS — Z6829 Body mass index (BMI) 29.0-29.9, adult: Secondary | ICD-10-CM | POA: Diagnosis not present

## 2016-12-26 DIAGNOSIS — M961 Postlaminectomy syndrome, not elsewhere classified: Secondary | ICD-10-CM | POA: Diagnosis not present

## 2016-12-26 DIAGNOSIS — I10 Essential (primary) hypertension: Secondary | ICD-10-CM | POA: Diagnosis not present

## 2016-12-26 DIAGNOSIS — M545 Low back pain: Secondary | ICD-10-CM | POA: Diagnosis not present

## 2016-12-27 DIAGNOSIS — L709 Acne, unspecified: Secondary | ICD-10-CM | POA: Diagnosis not present

## 2016-12-27 DIAGNOSIS — Z6841 Body Mass Index (BMI) 40.0 and over, adult: Secondary | ICD-10-CM | POA: Diagnosis not present

## 2016-12-27 DIAGNOSIS — I10 Essential (primary) hypertension: Secondary | ICD-10-CM | POA: Diagnosis not present

## 2016-12-27 DIAGNOSIS — G3184 Mild cognitive impairment, so stated: Secondary | ICD-10-CM | POA: Diagnosis not present

## 2017-01-24 DIAGNOSIS — R140 Abdominal distension (gaseous): Secondary | ICD-10-CM | POA: Diagnosis not present

## 2017-01-24 DIAGNOSIS — R69 Illness, unspecified: Secondary | ICD-10-CM | POA: Diagnosis not present

## 2017-01-24 DIAGNOSIS — G3184 Mild cognitive impairment, so stated: Secondary | ICD-10-CM | POA: Diagnosis not present

## 2017-01-24 DIAGNOSIS — Z6831 Body mass index (BMI) 31.0-31.9, adult: Secondary | ICD-10-CM | POA: Diagnosis not present

## 2017-01-24 DIAGNOSIS — R110 Nausea: Secondary | ICD-10-CM | POA: Diagnosis not present

## 2017-02-21 DIAGNOSIS — G3184 Mild cognitive impairment, so stated: Secondary | ICD-10-CM | POA: Diagnosis not present

## 2017-02-21 DIAGNOSIS — R69 Illness, unspecified: Secondary | ICD-10-CM | POA: Diagnosis not present

## 2017-02-21 DIAGNOSIS — Z6830 Body mass index (BMI) 30.0-30.9, adult: Secondary | ICD-10-CM | POA: Diagnosis not present

## 2017-03-03 DIAGNOSIS — S60222A Contusion of left hand, initial encounter: Secondary | ICD-10-CM | POA: Diagnosis not present

## 2017-03-22 DIAGNOSIS — Z6829 Body mass index (BMI) 29.0-29.9, adult: Secondary | ICD-10-CM | POA: Diagnosis not present

## 2017-03-22 DIAGNOSIS — I10 Essential (primary) hypertension: Secondary | ICD-10-CM | POA: Diagnosis not present

## 2017-03-22 DIAGNOSIS — M47817 Spondylosis without myelopathy or radiculopathy, lumbosacral region: Secondary | ICD-10-CM | POA: Diagnosis not present

## 2017-03-22 DIAGNOSIS — M961 Postlaminectomy syndrome, not elsewhere classified: Secondary | ICD-10-CM | POA: Diagnosis not present

## 2017-03-22 DIAGNOSIS — G8929 Other chronic pain: Secondary | ICD-10-CM | POA: Diagnosis not present

## 2017-04-24 ENCOUNTER — Telehealth: Payer: Self-pay | Admitting: Neurology

## 2017-04-24 NOTE — Telephone Encounter (Signed)
This patient saw you in 2014 and has been referred back to [place] and he now wants to see Dr Leta Baptist. Is it ok to switch?

## 2017-04-24 NOTE — Telephone Encounter (Signed)
This patient saw Dr Krista Blue in 2014 and has now been referred back to [facility]. They want to see you now. Is it ok to switch ? dg

## 2017-04-25 NOTE — Telephone Encounter (Signed)
It is Ok to switch 

## 2017-04-26 NOTE — Telephone Encounter (Signed)
Yes ok to switch. -VRP 

## 2017-05-24 DIAGNOSIS — D509 Iron deficiency anemia, unspecified: Secondary | ICD-10-CM | POA: Diagnosis not present

## 2017-05-24 DIAGNOSIS — E119 Type 2 diabetes mellitus without complications: Secondary | ICD-10-CM | POA: Diagnosis not present

## 2017-05-24 DIAGNOSIS — I10 Essential (primary) hypertension: Secondary | ICD-10-CM | POA: Diagnosis not present

## 2017-05-24 DIAGNOSIS — E291 Testicular hypofunction: Secondary | ICD-10-CM | POA: Diagnosis not present

## 2017-05-29 DIAGNOSIS — R5382 Chronic fatigue, unspecified: Secondary | ICD-10-CM | POA: Diagnosis not present

## 2017-05-29 DIAGNOSIS — R251 Tremor, unspecified: Secondary | ICD-10-CM | POA: Diagnosis not present

## 2017-05-29 DIAGNOSIS — E291 Testicular hypofunction: Secondary | ICD-10-CM | POA: Diagnosis not present

## 2017-05-29 DIAGNOSIS — M545 Low back pain: Secondary | ICD-10-CM | POA: Diagnosis not present

## 2017-05-29 DIAGNOSIS — Z6831 Body mass index (BMI) 31.0-31.9, adult: Secondary | ICD-10-CM | POA: Diagnosis not present

## 2017-05-29 DIAGNOSIS — R69 Illness, unspecified: Secondary | ICD-10-CM | POA: Diagnosis not present

## 2017-05-29 DIAGNOSIS — E782 Mixed hyperlipidemia: Secondary | ICD-10-CM | POA: Diagnosis not present

## 2017-05-29 DIAGNOSIS — D509 Iron deficiency anemia, unspecified: Secondary | ICD-10-CM | POA: Diagnosis not present

## 2017-05-29 DIAGNOSIS — I10 Essential (primary) hypertension: Secondary | ICD-10-CM | POA: Diagnosis not present

## 2017-05-29 DIAGNOSIS — Z23 Encounter for immunization: Secondary | ICD-10-CM | POA: Diagnosis not present

## 2017-05-29 DIAGNOSIS — G3184 Mild cognitive impairment, so stated: Secondary | ICD-10-CM | POA: Diagnosis not present

## 2017-05-29 DIAGNOSIS — E119 Type 2 diabetes mellitus without complications: Secondary | ICD-10-CM | POA: Diagnosis not present

## 2017-06-18 DIAGNOSIS — Z6830 Body mass index (BMI) 30.0-30.9, adult: Secondary | ICD-10-CM | POA: Diagnosis not present

## 2017-06-18 DIAGNOSIS — M961 Postlaminectomy syndrome, not elsewhere classified: Secondary | ICD-10-CM | POA: Diagnosis not present

## 2017-06-18 DIAGNOSIS — M47817 Spondylosis without myelopathy or radiculopathy, lumbosacral region: Secondary | ICD-10-CM | POA: Diagnosis not present

## 2017-06-18 DIAGNOSIS — I10 Essential (primary) hypertension: Secondary | ICD-10-CM | POA: Diagnosis not present

## 2017-06-21 ENCOUNTER — Other Ambulatory Visit: Payer: Self-pay | Admitting: Gastroenterology

## 2017-08-20 ENCOUNTER — Encounter: Payer: Self-pay | Admitting: Diagnostic Neuroimaging

## 2017-08-20 ENCOUNTER — Ambulatory Visit: Payer: Medicare HMO | Admitting: Diagnostic Neuroimaging

## 2017-08-20 VITALS — BP 138/75 | HR 57

## 2017-08-20 DIAGNOSIS — R413 Other amnesia: Secondary | ICD-10-CM | POA: Diagnosis not present

## 2017-08-20 DIAGNOSIS — M542 Cervicalgia: Secondary | ICD-10-CM

## 2017-08-20 DIAGNOSIS — G3281 Cerebellar ataxia in diseases classified elsewhere: Secondary | ICD-10-CM

## 2017-08-20 NOTE — Progress Notes (Signed)
[facility]  PATIENT: John Molina DOB: 1941-05-02  REFERRING CLINICIAN: Merlyn Albert, MD HISTORY FROM: patient and wife REASON FOR VISIT: new consult    HISTORICAL  CHIEF COMPLAINT:  Chief Complaint  Patient presents with  . Tremors    rm 7, New Pt, wife- Peter Congo, "issues x 3 years"  . Memory Loss    MMSE 22    HISTORY OF PRESENT ILLNESS:   76 year old male with hypertension, hyperglycemia, anxiety, here for evaluation of tremors, gait difficulty, lower semi-numbness, memory loss.  Patient has had gradual onset progressive short-term memory loss, confusion, forgetting conversations, forgetting recent events, having difficulty taking his medications, at least since 2015.  Symptoms are getting worse over time.  Patient tried Aricept but this caused nausea.  Now patient on memantine 5 mg twice a day.  Patient also has had some intermittent hallucinations, visual disturbance, seeing people and objects that are not there, seeing movement of objects when it is not occurring.  Patient also having low back pain problems since 2013.  He underwent low back surgery in 2014 and 2015.  However patient continues to have back pain, numbness and tingling in his feet and legs, gait and balance difficulty.  Since 2014 he has had intermittent sudden onset leg tremors when standing up or exerting himself.  He has had multiple falls in the last few months.  He declines to use a cane or walker.  He has not done physical therapy recently.  He has a mainly sedentary lifestyle lately.  Patient also has some intermittent tremors in his upper extremities.  Patient has some neck pain.    REVIEW OF SYSTEMS: Full 14 system review of systems performed and negative with exception of: Memory loss confusion numbness difficulty swallowing snoring depression anxiety too much sleep urination problems constipation hearing loss ringing in ears trouble swallowing.  ALLERGIES: Allergies  Allergen  Reactions  . Morphine And Related     Pt. States that it does not work for him    HOME MEDICATIONS: Outpatient Medications Prior to Visit  Medication Sig Dispense Refill  . amphetamine-dextroamphetamine (ADDERALL) 30 MG tablet Take 30 mg by mouth daily with breakfast.     . buPROPion (WELLBUTRIN XL) 300 MG 24 hr tablet 300 mg daily.    . citalopram (CELEXA) 40 MG tablet Take 40 mg by mouth daily.    . cloNIDine (CATAPRES) 0.2 MG tablet 0.2 mg daily.    Marland Kitchen HYDROmorphone (DILAUDID) 8 MG tablet Take 8 mg by mouth 3 (three) times daily.     Marland Kitchen levothyroxine (SYNTHROID, LEVOTHROID) 50 MCG tablet 50 mcg daily.    Marland Kitchen linaclotide (LINZESS) 145 MCG CAPS capsule Take 1 capsule (145 mcg total) by mouth daily before breakfast. 30 capsule 5  . losartan (COZAAR) 100 MG tablet Take 100 mg by mouth daily.    . memantine (NAMENDA) 10 MG tablet 5 mg 2 (two) times daily.    . mupirocin ointment (BACTROBAN) 2 %     . omeprazole (PRILOSEC) 20 MG capsule TAKE 1 CAPSULE BY MOUTH 30 MINUTES BEFORE BREAKFAST 90 capsule 3  . oxymorphone (OPANA ER) 30 MG 12 hr tablet Take 30 mg by mouth daily.     . propranolol (INDERAL) 40 MG tablet Take 40 mg by mouth 2 (two) times daily.    . rosuvastatin (CRESTOR) 5 MG tablet Take 5 mg by mouth daily.    . sodium-potassium bicarbonate (ALKA-SELTZER GOLD) TBEF dissolvable tablet Take 1 tablet by mouth daily as  needed.    . testosterone (ANDROGEL) 50 MG/5GM (1%) GEL Place 5 g onto the skin daily.    . cloNIDine (CATAPRES) 0.1 MG tablet 1 tab po q 8 hrs for 6 doses, then 1 tab po q am and qhs for 4 doses, then 1 tab q am for 2 doses. (Patient taking differently: Take 0.2 mg by mouth 2 (two) times daily. 1 tab po q 8 hrs for 6 doses, then 1 tab po q am and qhs for 4 doses, then 1 tab q am for 2 doses.) 13 tablet 0  . levothyroxine (SYNTHROID, LEVOTHROID) 25 MCG tablet Take 0.5 mcg by mouth daily.      No facility-administered medications prior to visit.     PAST MEDICAL  HISTORY: Past Medical History:  Diagnosis Date  . Anemia   . Arthritis   . Chronic back pain    pain medication for 20 years  . Colon cancer ([place]) 1999  . Depression   . High triglycerides   . Hyperlipidemia   . Hypertension   . Hypothyroidism   . Neuromuscular disorder (HCC)    tremors in both arms and legs  . Sleep apnea    does not use CPAP  . Tremor     PAST SURGICAL HISTORY: Past Surgical History:  Procedure Laterality Date  . ANTERIOR LATERAL LUMBAR FUSION WITH PERCUTANEOUS SCREW 1 LEVEL Left 12/29/2013   Procedure: LUMBAR THREE TO LUMBAR FOUR ANTERIOR LATERAL LUMBAR FUSION WITH PERCUTANEOUS SCREW 1 LEVEL;  Surgeon: Charlie Pitter, MD;  Location: [place] [facility];  Service: Neurosurgery;  Laterality: Left;  . APPENDECTOMY    . BACK SURGERY     x 2  . BALLOON DILATION  06/27/2016   Procedure: BALLOON DILATION;  Surgeon: Danie Binder, MD;  [facility];  Service: Endoscopy;;  . CATARACT EXTRACTION     bilateral  . COLON RESECTION  08/1998   s/p chemotherapy  . COLONOSCOPY N/A 05/09/2013   Procedure: COLONOSCOPY;  Surgeon: Danie Binder, MD;  [facility];  Service: Endoscopy;  Laterality: N/A;  10:45  . COLONOSCOPY WITH PROPOFOL N/A 06/27/2016   Dr. Oneida Alar: 2 simple adenomas. Surveillance in 5 years   . ESOPHAGOGASTRODUODENOSCOPY (EGD) WITH PROPOFOL N/A 06/27/2016   Dr. Oneida Alar: normal esophagus, gastritis, pyloric stenosis s/p dilation, duodenitis  . HERNIA REPAIR Bilateral     2  . KNEE SURGERY Left   . LUMBAR LAMINECTOMY/DECOMPRESSION MICRODISCECTOMY Bilateral 01/10/2013   Procedure: LUMBAR LAMINECTOMY/DECOMPRESSION MICRODISCECTOMY 2 LEVELS;  Surgeon: Charlie Pitter, MD;  Location: [place] [facility];  Service: Neurosurgery;  Laterality: Bilateral;  Bilateral lumbar three-four,Lumbar four-five  . POLYPECTOMY  06/27/2016   Procedure: POLYPECTOMY;  Surgeon: Danie Binder, MD;  [facility];  Service: Endoscopy;;  Ascending colon polyp, sigmoid  colon polyp  . PORTACATH PLACEMENT     and removal  . SHOULDER ACROMIOPLASTY Right 11/07/2012   Procedure: SHOULDER ACROMIOPLASTY;  Surgeon: Sanjuana Kava, MD;  Location: [facility];  Service: Orthopedics;  Laterality: Right;  . SHOULDER OPEN ROTATOR CUFF REPAIR Right 11/07/2012   Procedure: ROTATOR CUFF REPAIR SHOULDER OPEN;  Surgeon: Sanjuana Kava, MD;  Location: [facility];  Service: Orthopedics;  Laterality: Right;  Right Open Rotator Cuff Repair  . VASECTOMY      FAMILY HISTORY: Family History  Problem Relation Age of Onset  . Heart disease Mother   . Cancer - Lung Father   . Cancer - Lung Sister   . Colon cancer  Neg Hx     SOCIAL HISTORY:  Social History   Socioeconomic History  . Marital status: Married    Spouse name: Not on file  . Number of children: 2  . Years of education: Not on file  . Highest education level: Not on file  Social Needs  . Financial resource strain: Not on file  . Food insecurity - worry: Not on file  . Food insecurity - inability: Not on file  . Transportation needs - medical: Not on file  . Transportation needs - non-medical: Not on file  Occupational History  . Occupation: out since back injury, [employer] delivery    Employer: RETIRED  Tobacco Use  . Smoking status: Never Smoker  . Smokeless tobacco: Never Used  Substance and Sexual Activity  . Alcohol use: No    Comment: patient quit in 1988. patient drinks 5 cups of coffee weekly  . Drug use: No  . Sexual activity: Yes    Birth control/protection: None  Other Topics Concern  . Not on file  Social History Narrative   lives with wife   No caffeine   High school education   retired     PHYSICAL EXAM  GENERAL EXAM/CONSTITUTIONAL: Vitals:  Vitals:   08/20/17 1041  BP: 138/75  Pulse: (!) 57     There is no height or weight on file to calculate BMI.  Visual Acuity Screening   Right eye Left eye Both eyes  Without correction: 20/20 20/70   With correction:        Patient is in no  distress; well developed, nourished and groomed; neck is supple  CARDIOVASCULAR:  Examination of carotid arteries is normal; no carotid bruits  Regular rate and rhythm, no murmurs  Examination of peripheral vascular system by observation and palpation is normal  EYES:  Ophthalmoscopic exam of optic discs and posterior segments is normal; no papilledema or hemorrhages  MUSCULOSKELETAL:  Gait, strength, tone, movements noted in Neurologic exam below  NEUROLOGIC: MENTAL STATUS:  MMSE - [place] Exam 08/20/2017  Orientation to time 3  Orientation to Place 4  Registration 3  Attention/ Calculation 3  Recall 2  Language- name 2 objects 2  Language- repeat 0  Language- follow 3 step command 3  Language- read & follow direction 1  Write a sentence 1  Copy design 0  Total score 22    awake, alert, oriented to person, place and time  recent and remote memory intact  normal attention and concentration  language fluent, comprehension intact, naming intact,   fund of knowledge appropriate  CRANIAL NERVE:   2nd - no papilledema on fundoscopic exam  2nd, 3rd, 4th, 6th - pupils equal and reactive to light, visual fields full to confrontation, extraocular muscles intact, no nystagmus  5th - facial sensation symmetric  7th - facial strength symmetric  8th - hearing intact  9th - palate elevates symmetrically, uvula midline  11th - shoulder shrug symmetric  12th - tongue protrusion midline  SUBTLE MOUTH TREMOR  MOTOR:   POSTURAL > ACTION TREMOR  SUBTLE RESTING TREMOR  MILD BRADYKINESIA   BUE 5  BLE 4+ WITH SIGNIFICANT HIGH AMPLITUDE COARSE TREMOR WITH HIP FLEX, KNEE EXT AND KNEE FLEX; DF 5  normal bulk and tone, full strength in the BUE, BLE  SENSORY:   normal and symmetric to light touch  ABSENT VIB AT TOES; REDUCED AT KNEES (RIGHT WORSE THAN LEFT)  COORDINATION:   finger-nose-finger, fine finger movements --> INTENTION  TREMOR  REFLEXES:    deep tendon reflexes TRACE and symmetric  GAIT/STATION:   narrow based gait; UNSTEADY; STOOPED POSTURE    DIAGNOSTIC DATA (LABS, IMAGING, TESTING) - I reviewed patient records, labs, notes, testing and imaging myself where available.  Lab Results  Component Value Date   WBC 4.2 07/25/2016   HGB 14.9 07/25/2016   HCT 44.5 07/25/2016   MCV 92.7 07/25/2016   PLT 216 07/25/2016      Component Value Date/Time   NA 136 06/22/2016 1315   NA 143 01/06/2013   K 4.3 06/22/2016 1315   K 5.0 01/06/2013   CL 102 06/22/2016 1315   CO2 30 06/22/2016 1315   GLUCOSE 129 (H) 06/22/2016 1315   BUN 18 06/22/2016 1315   BUN 10 01/06/2013   CREATININE 1.01 06/22/2016 1315   CREATININE 0.85 01/06/2013   CALCIUM 9.4 06/22/2016 1315   CALCIUM 10.3 01/06/2013   PROT 7.9 05/30/2014 1722   ALBUMIN 4.4 05/30/2014 1722   ALBUMIN 4.8 01/06/2013   AST 48 (H) 05/30/2014 1722   AST 23 01/06/2013   ALT 75 (H) 05/30/2014 1722   ALKPHOS 69 05/30/2014 1722   ALKPHOS 51.0 01/06/2013   BILITOT 0.4 05/30/2014 1722   BILITOT 0.6 01/06/2013   GFRNONAA >60 06/22/2016 1315   GFRAA >60 06/22/2016 1315   No results found for: CHOL, HDL, LDLCALC, LDLDIRECT, TRIG, CHOLHDL No results found for: HGBA1C No results found for: VITAMINB12 No results found for: TSH   10/22/13 CT myelogram 1. Prior L3-4 and L4-5 posterior decompression without evidence of spinal stenosis. 2. Multilevel neural foraminal stenosis from L3-4 to L5-S1, greatest on the right at L5-S1. 3. Bilateral L3 pars defects.   05/05/13 MRI lumbar  1.  Postoperative change L3-4 and L4-5 as described.  The thecal sac appears patent at each level with epidural enhancement about the operative site and exiting nerve roots identified.  Disc bulging results in some residual foraminal narrowing most notable at L4-5 but no nerve root compression is identified. 2.  Disc bulge and facet arthropathy cause moderate to moderately severe foraminal  narrowing at L5-S1, worse on the right.     ASSESSMENT AND PLAN  76 y.o. year old male here with gradual onset progressive short-term memory loss, confusion, visual hallucinations, subtle resting tremor, significant postural and intention tremor, gait and balance difficulty, suspicious for dementia with Lewy bodies.  Also with history of lumbar spinal stenosis status post surgery x2 in 2014 in 2015, with resultant lower extremity numbness, weakness, gait difficulty.  Also with underlying anxiety disorder.   Dx: dementia with lewy bodies + lumbar spinal stenosis + anxiety  1. Memory loss   2. Cerebellar ataxia in diseases classified elsewhere ([place])   3. Cervicalgia      PLAN:  - check MRI brain and cervical spine (rule out stroke, cervical radiculopathy, cervical myelopathy) - physical therapy evaluation - use rollator walker - no driving - brain healthy activities reviewed  Orders Placed This Encounter  Procedures  . For home use only DME 4 wheeled rolling walker with seat  . MR BRAIN WO CONTRAST  . MR CERVICAL SPINE WO CONTRAST  . Ambulatory referral to Physical Therapy   Return in about 4 months (around 12/19/2017).    Penni Bombard, MD 57/10/6201, 55:97 AM Certified in Neurology, Neurophysiology and Neuroimaging  [facility] [address], [place], [place] [address] [phone]

## 2017-08-20 NOTE — Patient Instructions (Addendum)
Thank you for coming to see [place] at [facility]. I hope we have been able to provide you high quality care today.  You may receive a patient satisfaction survey over the next few weeks. We would appreciate your feedback and comments so that we may continue to improve ourselves and the health of our patients.  - check MRI brain and cervical spine - physical therapy evaluation - use rollator walker - no driving - brain healthy activities reviewed  ~~~~~~~~~~~~~~~~~~~~~~~~~~~~~~~~~~~~~~~~~~~~~~~~~~~~~~~~~~~~~~~~~  DR. Alyson Ki'S GUIDE TO HAPPY AND HEALTHY LIVING These are some of my general health and wellness recommendations. Some of them may apply to you better than others. Please use common sense as you try these suggestions and feel free to ask me any questions.   ACTIVITY/FITNESS Mental, social, emotional and physical stimulation are very important for brain and body health. Try learning a new activity (arts, music, language, sports, games).  Keep moving your body to the best of your abilities. You can do this at home, inside or outside, the park, community center, gym or anywhere you like. Consider a physical therapist or personal trainer to get started. Consider the app Sworkit. Fitness trackers such as smart-watches, smart-phones or Fitbits can help as well.   NUTRITION Eat more plants: colorful vegetables, nuts, seeds and berries.  Eat less sugar, salt, preservatives and processed foods.  Avoid toxins such as cigarettes and alcohol.  Drink water when you are thirsty. Warm water with a slice of lemon is an excellent morning drink to start the day.  Consider these websites for more information The Nutrition Source (https://www.henry-hernandez.biz/) [employer] Nutrition (WindowBlog.ch)   RELAXATION Consider practicing mindfulness meditation or other relaxation techniques such as deep breathing, prayer, yoga, tai chi,  massage. See website mindful.org or the apps Headspace or Calm to help get started.   SLEEP Try to get at least 7-8+ hours sleep per day. Regular exercise and reduced caffeine will help you sleep better. Practice good sleep hygeine techniques. See website sleep.org for more information.   PLANNING Prepare estate planning, living will, healthcare POA documents. Sometimes this is best planned with the help of an attorney. Theconversationproject.org and agingwithdignity.org are excellent resources.

## 2017-08-30 ENCOUNTER — Encounter: Payer: Self-pay | Admitting: Gastroenterology

## 2017-08-30 DIAGNOSIS — D509 Iron deficiency anemia, unspecified: Secondary | ICD-10-CM | POA: Diagnosis not present

## 2017-08-30 DIAGNOSIS — Z125 Encounter for screening for malignant neoplasm of prostate: Secondary | ICD-10-CM | POA: Diagnosis not present

## 2017-08-30 DIAGNOSIS — E782 Mixed hyperlipidemia: Secondary | ICD-10-CM | POA: Diagnosis not present

## 2017-08-30 DIAGNOSIS — N529 Male erectile dysfunction, unspecified: Secondary | ICD-10-CM | POA: Diagnosis not present

## 2017-08-30 DIAGNOSIS — E119 Type 2 diabetes mellitus without complications: Secondary | ICD-10-CM | POA: Diagnosis not present

## 2017-08-30 DIAGNOSIS — I10 Essential (primary) hypertension: Secondary | ICD-10-CM | POA: Diagnosis not present

## 2017-09-04 DIAGNOSIS — G3184 Mild cognitive impairment, so stated: Secondary | ICD-10-CM | POA: Diagnosis not present

## 2017-09-04 DIAGNOSIS — M150 Primary generalized (osteo)arthritis: Secondary | ICD-10-CM | POA: Diagnosis not present

## 2017-09-04 DIAGNOSIS — R296 Repeated falls: Secondary | ICD-10-CM | POA: Diagnosis not present

## 2017-09-04 DIAGNOSIS — G3281 Cerebellar ataxia in diseases classified elsewhere: Secondary | ICD-10-CM | POA: Diagnosis not present

## 2017-09-04 DIAGNOSIS — R251 Tremor, unspecified: Secondary | ICD-10-CM | POA: Diagnosis not present

## 2017-09-04 DIAGNOSIS — E119 Type 2 diabetes mellitus without complications: Secondary | ICD-10-CM | POA: Diagnosis not present

## 2017-09-04 DIAGNOSIS — N529 Male erectile dysfunction, unspecified: Secondary | ICD-10-CM | POA: Diagnosis not present

## 2017-09-04 DIAGNOSIS — R69 Illness, unspecified: Secondary | ICD-10-CM | POA: Diagnosis not present

## 2017-09-04 DIAGNOSIS — R2681 Unsteadiness on feet: Secondary | ICD-10-CM | POA: Diagnosis not present

## 2017-09-04 DIAGNOSIS — R944 Abnormal results of kidney function studies: Secondary | ICD-10-CM | POA: Diagnosis not present

## 2017-09-04 DIAGNOSIS — R5382 Chronic fatigue, unspecified: Secondary | ICD-10-CM | POA: Diagnosis not present

## 2017-09-04 DIAGNOSIS — E291 Testicular hypofunction: Secondary | ICD-10-CM | POA: Diagnosis not present

## 2017-09-04 DIAGNOSIS — E782 Mixed hyperlipidemia: Secondary | ICD-10-CM | POA: Diagnosis not present

## 2017-09-04 DIAGNOSIS — G894 Chronic pain syndrome: Secondary | ICD-10-CM | POA: Diagnosis not present

## 2017-09-04 DIAGNOSIS — G8929 Other chronic pain: Secondary | ICD-10-CM | POA: Diagnosis not present

## 2017-09-04 DIAGNOSIS — I10 Essential (primary) hypertension: Secondary | ICD-10-CM | POA: Diagnosis not present

## 2017-09-07 ENCOUNTER — Ambulatory Visit
Admission: RE | Admit: 2017-09-07 | Discharge: 2017-09-07 | Disposition: A | Discharge status: Home / Self Care | Payer: Medicare HMO | Source: Ambulatory Visit | Attending: Diagnostic Neuroimaging | Admitting: Diagnostic Neuroimaging

## 2017-09-07 ENCOUNTER — Ambulatory Visit: Payer: Medicare HMO | Attending: Diagnostic Neuroimaging

## 2017-09-07 DIAGNOSIS — R260 Ataxic gait: Secondary | ICD-10-CM | POA: Insufficient documentation

## 2017-09-07 DIAGNOSIS — R270 Ataxia, unspecified: Secondary | ICD-10-CM | POA: Diagnosis not present

## 2017-09-07 DIAGNOSIS — R413 Other amnesia: Secondary | ICD-10-CM

## 2017-09-07 DIAGNOSIS — R2689 Other abnormalities of gait and mobility: Secondary | ICD-10-CM | POA: Diagnosis not present

## 2017-09-07 DIAGNOSIS — G3281 Cerebellar ataxia in diseases classified elsewhere: Secondary | ICD-10-CM

## 2017-09-07 DIAGNOSIS — R251 Tremor, unspecified: Secondary | ICD-10-CM | POA: Diagnosis present

## 2017-09-07 DIAGNOSIS — M542 Cervicalgia: Secondary | ICD-10-CM

## 2017-09-07 DIAGNOSIS — M6281 Muscle weakness (generalized): Secondary | ICD-10-CM | POA: Diagnosis present

## 2017-09-07 NOTE — Therapy (Signed)
[facility] [address] [place], [place], [address] Phone: [phone]   Fax:  [phone]  Physical Therapy Evaluation  Patient Details  Name: John Molina MRN: [id number] Date of Birth: 1941-06-10 Referring Provider: Dr. Leta Baptist   Encounter Date: 09/07/2017  PT End of Session - 09/07/17 1351    Visit Number  1    Number of Visits  9    Date for PT Re-Evaluation  10/07/17    Authorization Type  [employer] Medicare. G-CODE AND PN EVERY 10TH VISIT.     PT Start Time  1300    PT Stop Time  1339    PT Time Calculation (min)  39 min    Equipment Utilized During Treatment  Gait belt    Activity Tolerance  Patient tolerated treatment well    Behavior During Therapy  WFL for tasks assessed/performed       Past Medical History:  Diagnosis Date  . Anemia   . Arthritis   . Chronic back pain    pain medication for 20 years  . Colon cancer ([place]) 1999  . Depression   . High triglycerides   . Hyperlipidemia   . Hypertension   . Hypothyroidism   . Neuromuscular disorder (HCC)    tremors in both arms and legs  . Sleep apnea    does not use CPAP  . Tremor     Past Surgical History:  Procedure Laterality Date  . ANTERIOR LATERAL LUMBAR FUSION WITH PERCUTANEOUS SCREW 1 LEVEL Left 12/29/2013   Procedure: LUMBAR THREE TO LUMBAR FOUR ANTERIOR LATERAL LUMBAR FUSION WITH PERCUTANEOUS SCREW 1 LEVEL;  Surgeon: Charlie Pitter, MD;  Location: [facility];  Service: Neurosurgery;  Laterality: Left;  . APPENDECTOMY    . BACK SURGERY     x 2  . BALLOON DILATION  06/27/2016   Procedure: BALLOON DILATION;  Surgeon: Danie Binder, MD;  [facility];  Service: Endoscopy;;  . CATARACT EXTRACTION     bilateral  . COLON RESECTION  08/1998   s/p chemotherapy  . COLONOSCOPY N/A 05/09/2013   Procedure: COLONOSCOPY;  Surgeon: Danie Binder, MD;  [facility];  Service: Endoscopy;  Laterality: N/A;  10:45  .  COLONOSCOPY WITH PROPOFOL N/A 06/27/2016   Dr. Oneida Alar: 2 simple adenomas. Surveillance in 5 years   . ESOPHAGOGASTRODUODENOSCOPY (EGD) WITH PROPOFOL N/A 06/27/2016   Dr. Oneida Alar: normal esophagus, gastritis, pyloric stenosis s/p dilation, duodenitis  . HERNIA REPAIR Bilateral     2  . KNEE SURGERY Left   . LUMBAR LAMINECTOMY/DECOMPRESSION MICRODISCECTOMY Bilateral 01/10/2013   Procedure: LUMBAR LAMINECTOMY/DECOMPRESSION MICRODISCECTOMY 2 LEVELS;  Surgeon: Charlie Pitter, MD;  Location: [facility];  Service: Neurosurgery;  Laterality: Bilateral;  Bilateral lumbar three-four,Lumbar four-five  . POLYPECTOMY  06/27/2016   Procedure: POLYPECTOMY;  Surgeon: Danie Binder, MD;  [facility];  Service: Endoscopy;;  Ascending colon polyp, sigmoid colon polyp  . PORTACATH PLACEMENT     and removal  . SHOULDER ACROMIOPLASTY Right 11/07/2012   Procedure: SHOULDER ACROMIOPLASTY;  Surgeon: Sanjuana Kava, MD;  Location: [facility];  Service: Orthopedics;  Laterality: Right;  . SHOULDER OPEN ROTATOR CUFF REPAIR Right 11/07/2012   Procedure: ROTATOR CUFF REPAIR SHOULDER OPEN;  Surgeon: Sanjuana Kava, MD;  Location: [facility];  Service: Orthopedics;  Laterality: Right;  Right Open Rotator Cuff Repair  . VASECTOMY      There were no vitals filed for this visit.  Subjective Assessment - 09/07/17 1307    Subjective  Pt's wife assisted in hx, due to pt's impaired memory. Pt reports he's not sure when balance impairments and tremors began (in BUE/BLE), approx. 3 years ago. Pt feels like he's getting worse everyday. Pt got a rollator but doesn't uses it. Pt fell approx. 8 times in November, and about 10-11 falls in the last six months. Pt has difficulty stepping into shower, as the threshold is 9" and tremors will begin. Pt utilizes shower chair. Pt has MRI of brain and neck scheduled today. Pt reports his B feet turn red when sitting, PT educated pt to notify PCP.     Patient is accompained by:  Family member     Pertinent History  HLD, HTN, dementia, sleep apnea (pt won't use CPAP), depression, anemia, hx of colon CA in 1999-in remission, diastasis recti, spinal stenosis (lumbar), tremors, spondylolisthesis L3-L4, arthitis, hypothyroidism    Patient Stated Goals  To be able to walk straight.    Currently in Pain?  No/denies         [facility] PT Assessment - 09/07/17 1315      Assessment   Medical Diagnosis  Memory loss, cerebellar ataxia in diseases classified elsewhere, cervicalgia    Referring Provider  Dr. Leta Baptist    Onset Date/Surgical Date  09/07/14    Hand Dominance  Right    Prior Therapy  none for balance      Precautions   Precautions  Fall      Restrictions   Weight Bearing Restrictions  No      Balance Screen   Has the patient fallen in the past 6 months  Yes    How many times?  10-11    Has the patient had a decrease in activity level because of a fear of falling?   Yes    Is the patient reluctant to leave their home because of a fear of falling?   Yes      [place] residence    Living Arrangements  Spouse/significant other    Available Help at Discharge  Family    Type of [place]  One level    [place] - 4 wheels;Shower seat      Prior Function   Level of Independence  Independent    Vocation  Retired    Leisure  Clinical research associate   Overall Cognitive Status  History of cognitive impairments - at baseline      Sensation   Light Touch  Impaired by gross assessment    Additional Comments  Pt reports constant N/T in B lower leg (it starts halfway between knee and ankle) and travels distally to B feet. Pt reports decr. light touch in B lower legs      Coordination   Gross Motor Movements are Fluid and Coordinated  No    Fine Motor Movements are Fluid and Coordinated  No      Tone   Assessment Location  Right Lower Extremity;Left Lower Extremity      ROM  / Strength   AROM / PROM / Strength  AROM;Strength      AROM   Overall AROM   Deficits    Overall AROM Comments  BUE/BLE AROM WFL except for decr. B ankle DF.      Strength  Overall Strength  Deficits    Overall Strength Comments  BUE/BLE WNL (5/5, except for 2/5 B ankle DF). B hip ext not tested but weakness suspected 2/2 gait deviations.       Transfers   Transfers  Sit to Stand;Stand to Sit    Sit to Stand  5: Supervision;With upper extremity assist;From chair/3-in-1    Stand to Sit  5: Supervision;With upper extremity assist;To chair/3-in-1    Comments  Pt reports he has difficulty getting up from lower, softer surfaces.       Ambulation/Gait   Ambulation/Gait  Yes    Ambulation/Gait Assistance  4: Min guard    Ambulation Distance (Feet)  100 Feet    Assistive device  None    Gait Pattern  Step-through pattern;Decreased stride length;Decreased dorsiflexion - left;Decreased dorsiflexion - right;Decreased hip/knee flexion - right;Decreased hip/knee flexion - left;Ataxic;Wide base of support    Ambulation Surface  Level;Indoor    Gait velocity  2.3ft/sec. no AD      Balance   Balance Assessed  Yes      Static Standing Balance   Static Standing - Balance Support  No upper extremity supported    Static Standing - Level of Assistance  4: Min assist    Static Standing - Comment/# of Minutes  Pt required UE support or min A to maintain upright static standing 2/2 incr. in tremors.       Standardized Balance Assessment   Standardized Balance Assessment  Timed Up and Go Test      Timed Up and Go Test   TUG  Normal TUG    Normal TUG (seconds)  11.9 no AD but required min A to min guard for safety.      RLE Tone   RLE Tone  Modified Ashworth      RLE Tone   Modified Ashworth Scale for Grading Hypertonia RLE  No increase in muscle tone but tremors noted      LLE Tone   LLE Tone  Modified Ashworth no incr. tone but tremors noted.      LLE Tone   Modified Ashworth Scale for  Grading Hypertonia LLE  No increase in muscle tone             Objective measurements completed on examination: See above findings.              PT Education - 09/07/17 1348    Education provided  Yes    Education Details  PT educated pt on outcome measures results. PT discussed PT POC, frequency and duration. PT explained that carryover can be challenging with hx of cognitive impairments and that PT focus will be caregiver ed and HEP to improve safety. PT educated pt on the benefits of using rollator for safety and asked pt to bring to next session.     Person(s) Educated  Patient    Methods  Explanation    Comprehension  Verbalized understanding       PT Short Term Goals - 09/07/17 1358      PT SHORT TERM GOAL #1   Title  same as LTGs        PT Long Term Goals - 09/07/17 1358      PT LONG TERM GOAL #1   Title  Pt will perform HEP with S from wife in order to improve safety, strength, flexibility and balance. TARGET DATE FOR ALL LTGS: 10/05/17    Status  New      PT  LONG TERM GOAL #2   Title  Pt and wife will verbalize understanding of fall prevention strategies to decr. falls risk.     Status  New      PT LONG TERM GOAL #3   Title  Amb. with rollator and write goal as indicated.     Status  New      PT LONG TERM GOAL #4   Title  Pt will improve gait speed with LRAD to >/=2.62 ft/sec to safely amb. in community.     Status  New      PT LONG TERM GOAL #5   Title  Perform BERG and write goal as indicated and tolerated by pt.     Status  New             Plan - 09/07/17 1352    Clinical Impression Statement  Pt is a pleasant 76y/o presenting to [facility] neuro with cerebellar ataxia, memory loss, and cervicalgia-although no c/o pain during session. Pt's PMH significant for the following: HLD, HTN, dementia, sleep apnea (pt won't use CPAP), depression, anemia, hx of colon CA in 1999-in remission, diastasis recti, spinal stenosis (lumbar), tremors,  spondylolisthesis L3-L4, arthitis, hypothyroidism. Pt's TUG time and gait speed indicate pt is not at high risk for falls, however, pt required min A to maintain balance upon standing upright and has hx of falls. Pt required min A to maintain balance in static standing, or UE support with S. BERG not performed, as pt's tremors incr. during static standing and he required seated rest breaks after 30 sec. of static standing. The following deficits were noted during exam: gait deviations, ataxia/impaired coordination, impaired cognition, decr. B ankle DF, pain reported but not during session, impaired flexibility, tremors, impaired sensation. and decr. safety awareness. Pt would benefit from skilled PT to improve safety during functional mobility and to decr. caregiver burden.     History and Personal Factors relevant to plan of care:  Frequent falls, pt's wife is his caregiver, memory impairments    Clinical Presentation  Stable    Clinical Presentation due to:  HLD, HTN, dementia, sleep apnea (pt won't use CPAP), depression, anemia, hx of colon CA in 1999-in remission, diastasis recti, spinal stenosis (lumbar), tremors, spondylolisthesis L3-L4, arthitis, hypothyroidism    Clinical Decision Making  Moderate    Rehab Potential  Fair    Clinical Impairments Affecting Rehab Potential  see above.     PT Frequency  2x / week    PT Duration  4 weeks    PT Treatment/Interventions  ADLs/Self Care Home Management;Biofeedback;Electrical Stimulation;Therapeutic activities;Therapeutic exercise;Manual techniques;Functional mobility training;Orthotic Fit/Training;Stair training;Gait training;Patient/family education;DME Instruction;Cognitive remediation;Neuromuscular re-education;Balance training    PT Next Visit Plan  Initiate strengthening, flexibility and balance HEP.     Consulted and Agree with Plan of Care  Patient;Family member/caregiver    Family Member Consulted  pt's wife: Peter Congo       Patient will benefit  from skilled therapeutic intervention in order to improve the following deficits and impairments:  Decreased endurance, Abnormal gait, Decreased knowledge of use of DME, Decreased activity tolerance, Decreased balance, Decreased mobility, Pain, Decreased cognition, Decreased range of motion, Impaired flexibility, Postural dysfunction, Decreased safety awareness, Decreased coordination, Impaired sensation  Visit Diagnosis: Other abnormalities of gait and mobility - Plan: PT plan of care cert/re-cert  Ataxic gait - Plan: PT plan of care cert/re-cert  Muscle weakness (generalized) - Plan: PT plan of care cert/re-cert  Tremor - Plan: PT plan of care cert/re-cert  G-Codes - 09/07/17 1400    Functional Assessment Tool Used (Outpatient Only)  Gait speed no AD: 2.3ft/sec with min guard to S, TUG no AD with min A: 11.9 sec; required min A during static standing with feet apart.    Functional Limitation  Mobility: Walking and moving around    Mobility: Walking and Moving Around Current Status [phone])  At least 60 percent but less than 80 percent impaired, limited or restricted    Mobility: Walking and Moving Around Goal Status [phone])  At least 20 percent but less than 40 percent impaired, limited or restricted        Problem List Patient Active Problem List   Diagnosis Date Noted  . Nausea with vomiting 06/06/2016  . Upper abdominal pain 06/06/2016  . Spondylolisthesis at L3-L4 level 12/29/2013  . Diastasis recti 08/12/2013  . Groin strain-Left 07/08/2013  . History of colon cancer 05/06/2013  . Unspecified constipation 05/06/2013  . Anemia 05/06/2013  . Tremor 02/28/2013  . Lower back pain 02/28/2013  . Spinal stenosis, lumbar region, with neurogenic claudication 01/10/2013    Fatim Vanderschaaf L 09/07/2017, 2:02 PM  [facility] [address] [place], [place], [address] Phone: [phone]   Fax:  [phone]  Name:  John Molina MRN: [id number] Date of Birth: 30-May-1941  Geoffry Paradise, PT,DPT 09/07/17 2:02 PM Phone: [phone] Fax: [phone]

## 2017-09-12 ENCOUNTER — Encounter: Payer: Self-pay | Admitting: Physical Therapy

## 2017-09-12 ENCOUNTER — Ambulatory Visit: Payer: Medicare HMO | Admitting: Physical Therapy

## 2017-09-12 DIAGNOSIS — R2689 Other abnormalities of gait and mobility: Secondary | ICD-10-CM | POA: Diagnosis not present

## 2017-09-12 NOTE — Therapy (Signed)
[facility] [address] [place], [place], [address] Phone: [phone]   Fax:  [phone]  Physical Therapy Treatment  Patient Details  Name: John Molina MRN: [id number] Date of Birth: 31-Dec-1940 Referring Provider: Dr. Leta Baptist   Encounter Date: 09/12/2017  PT End of Session - 09/12/17 1626    Visit Number  2    Number of Visits  9    Date for PT Re-Evaluation  10/07/17    Authorization Type  [employer] Medicare. G-CODE AND PN EVERY 10TH VISIT.     PT Start Time  1333    PT Stop Time  1414    PT Time Calculation (min)  41 min    Equipment Utilized During Treatment  Gait belt    Activity Tolerance  Patient tolerated treatment well    Behavior During Therapy  WFL for tasks assessed/performed       Past Medical History:  Diagnosis Date  . Anemia   . Arthritis   . Chronic back pain    pain medication for 20 years  . Colon cancer ([place]) 1999  . Depression   . High triglycerides   . Hyperlipidemia   . Hypertension   . Hypothyroidism   . Neuromuscular disorder (HCC)    tremors in both arms and legs  . Sleep apnea    does not use CPAP  . Tremor     Past Surgical History:  Procedure Laterality Date  . ANTERIOR LATERAL LUMBAR FUSION WITH PERCUTANEOUS SCREW 1 LEVEL Left 12/29/2013   Procedure: LUMBAR THREE TO LUMBAR FOUR ANTERIOR LATERAL LUMBAR FUSION WITH PERCUTANEOUS SCREW 1 LEVEL;  Surgeon: Charlie Pitter, MD;  Location: [facility];  Service: Neurosurgery;  Laterality: Left;  . APPENDECTOMY    . BACK SURGERY     x 2  . BALLOON DILATION  06/27/2016   Procedure: BALLOON DILATION;  Surgeon: Danie Binder, MD;  [facility];  Service: Endoscopy;;  . CATARACT EXTRACTION     bilateral  . COLON RESECTION  08/1998   s/p chemotherapy  . COLONOSCOPY N/A 05/09/2013   Procedure: COLONOSCOPY;  Surgeon: Danie Binder, MD;  [facility];  Service: Endoscopy;  Laterality: N/A;  10:45  .  COLONOSCOPY WITH PROPOFOL N/A 06/27/2016   Dr. Oneida Alar: 2 simple adenomas. Surveillance in 5 years   . ESOPHAGOGASTRODUODENOSCOPY (EGD) WITH PROPOFOL N/A 06/27/2016   Dr. Oneida Alar: normal esophagus, gastritis, pyloric stenosis s/p dilation, duodenitis  . HERNIA REPAIR Bilateral     2  . KNEE SURGERY Left   . LUMBAR LAMINECTOMY/DECOMPRESSION MICRODISCECTOMY Bilateral 01/10/2013   Procedure: LUMBAR LAMINECTOMY/DECOMPRESSION MICRODISCECTOMY 2 LEVELS;  Surgeon: Charlie Pitter, MD;  Location: [facility];  Service: Neurosurgery;  Laterality: Bilateral;  Bilateral lumbar three-four,Lumbar four-five  . POLYPECTOMY  06/27/2016   Procedure: POLYPECTOMY;  Surgeon: Danie Binder, MD;  [facility];  Service: Endoscopy;;  Ascending colon polyp, sigmoid colon polyp  . PORTACATH PLACEMENT     and removal  . SHOULDER ACROMIOPLASTY Right 11/07/2012   Procedure: SHOULDER ACROMIOPLASTY;  Surgeon: Sanjuana Kava, MD;  [facility]: [facility];  Service: Orthopedics;  Laterality: Right;  . SHOULDER OPEN ROTATOR CUFF REPAIR Right 11/07/2012   Procedure: ROTATOR CUFF REPAIR SHOULDER OPEN;  Surgeon: Sanjuana Kava, MD;  Location: [facility];  Service: Orthopedics;  Laterality: Right;  Right Open Rotator Cuff Repair  . VASECTOMY      There were no vitals filed for this visit.  Subjective  Assessment - 09/12/17 1538    Subjective  Brought in walker, per Jennifer's request to measure for the correct height.  One near fall in the kitchen, just loses his balance while trying to turn.    Patient is accompained by:  Family member    Pertinent History  HLD, HTN, dementia, sleep apnea (pt won't use CPAP), depression, anemia, hx of colon CA in 1999-in remission, diastasis recti, spinal stenosis (lumbar), tremors, spondylolisthesis L3-L4, arthitis, hypothyroidism    Patient Stated Goals  To be able to walk straight.    Currently in Pain?  Yes    Pain Score  5     Pain Location  Back    Pain Orientation  Right;Lower    Pain  Descriptors / Indicators  Aching    Pain Type  Chronic pain    Pain Onset  More than a month ago    Pain Frequency  Intermittent    Aggravating Factors   Making the bed, showering, brushing teeth and standing too long    Pain Relieving Factors  sitting back down                      [facility] Adult PT Treatment/Exercise - 09/12/17 1548      Transfers   Transfers  Sit to Stand;Stand to Sit    Sit to Stand  5: Supervision;With upper extremity assist;From chair/3-in-1;From bed    Stand to Sit  5: Supervision;With upper extremity assist;To chair/3-in-1;To bed    Comments  Multiple reps from mat surface; cues for walker placement, use of rollator walker brakes, hand placement for sit<>stand      Ambulation/Gait   Ambulation/Gait  Yes    Ambulation/Gait Assistance  4: Min guard    Ambulation Distance (Feet)  315 Feet x 2, then 175 ft with rollator walker (in 2 minutes)    Assistive device  Rollator    Gait Pattern  Step-through pattern;Decreased stride length;Decreased dorsiflexion - left;Decreased dorsiflexion - right;Decreased hip/knee flexion - right;Decreased hip/knee flexion - left;Ataxic;Wide base of support    Ambulation Surface  Level;Indoor    Gait Comments  Practiced 20-25 ft of walking with rollator walker, with turns, x 2 minutes, simulating walking in hallway at home.  Cues provided for slowed pace, improved foot clearance with turns.      Standardized Balance Assessment   Standardized Balance Assessment  Berg Balance Test Attempted-unable to complete      Berg Balance Test   Sit to Stand  Able to stand  independently using hands    Standing Unsupported  Unable to stand 30 seconds unassisted 13-14 seconds before lower extremities tremor, needs UE supp    Stand to Sit  Controls descent by using hands      Attempted Berg Balance test, but unable to complete due to pt not able to stand >14 seconds without UE support.    Educated patient and wife in walking program  for home, starting at 2 minutes using rollator walker and wife's supervision, along hallway for improved gait endurance and practice with rollator walker.  Cues for slowed pace, cues for increased foot clearance.     Self Care:  Focused education on safety with gait, with recommendations to use rollator walker to improve safety with gait.  Discussed limitations in standing balance due to tremors and benefits of use of rollator walker for consistent UE support.  Educated wife on providing supervision for gait, cues to make sure patient uses brakes correctly  and stays close within rollator BOS.   PT Education - 09/12/17 1623    Education provided  Yes    Education Details  safety with gait, USING rollator walker; educated wife in need for supervision using rollator for improved safety with gait; walking program using rollator walker at home    Person(s) Educated  Patient    Methods  Explanation    Comprehension  Verbalized understanding;Returned demonstration;Verbal cues required       PT Short Term Goals - 09/07/17 1358      PT SHORT TERM GOAL #1   Title  same as LTGs        PT Long Term Goals - 09/07/17 1358      PT LONG TERM GOAL #1   Title  Pt will perform HEP with S from wife in order to improve safety, strength, flexibility and balance. TARGET DATE FOR ALL LTGS: 10/05/17    Status  New      PT LONG TERM GOAL #2   Title  Pt and wife will verbalize understanding of fall prevention strategies to decr. falls risk.     Status  New      PT LONG TERM GOAL #3   Title  Amb. with rollator and write goal as indicated.     Status  New      PT LONG TERM GOAL #4   Title  Pt will improve gait speed with LRAD to >/=2.62 ft/sec to safely amb. in community.     Status  New      PT LONG TERM GOAL #5   Title  Perform BERG and write goal as indicated and tolerated by pt.     Status  New            Plan - 09/12/17 1626    Clinical Impression Statement  Pt and wife bring in his  rollator walker today, but report that he has not used yet.  Instructed again in benefits of use of rollator walker, for improved safety with gait and instructed in use of rollator walker.  Attempted Berg Balance test again, but pt unable to stand >13-14 seconds without UE support, prior to lower extremity tremors and unsteadiness in standing, needing UE support.  Pt will continue to benefit from skilled PT to improve safety with gait.    Rehab Potential  Fair    Clinical Impairments Affecting Rehab Potential  see above.     PT Frequency  2x / week    PT Duration  4 weeks    PT Treatment/Interventions  ADLs/Self Care Home Management;Biofeedback;Electrical Stimulation;Therapeutic activities;Therapeutic exercise;Manual techniques;Functional mobility training;Orthotic Fit/Training;Stair training;Gait training;Patient/family education;DME Instruction;Cognitive remediation;Neuromuscular re-education;Balance training    PT Next Visit Plan  Initiate strengthening, flexibility and balance HEP; review walking program initiated 09/12/17 and use of rollator at home    Consulted and Agree with Plan of Care  Patient;Family member/caregiver    Family Member Consulted  pt's wife: Peter Congo       Patient will benefit from skilled therapeutic intervention in order to improve the following deficits and impairments:  Decreased endurance, Abnormal gait, Decreased knowledge of use of DME, Decreased activity tolerance, Decreased balance, Decreased mobility, Pain, Decreased cognition, Decreased range of motion, Impaired flexibility, Postural dysfunction, Decreased safety awareness, Decreased coordination, Impaired sensation  Visit Diagnosis: Other abnormalities of gait and mobility     Problem List Patient Active Problem List   Diagnosis Date Noted  . Nausea with vomiting 06/06/2016  . Upper abdominal pain 06/06/2016  .  Spondylolisthesis at L3-L4 level 12/29/2013  . Diastasis recti 08/12/2013  . Groin strain-Left  07/08/2013  . History of colon cancer 05/06/2013  . Unspecified constipation 05/06/2013  . Anemia 05/06/2013  . Tremor 02/28/2013  . Lower back pain 02/28/2013  . Spinal stenosis, lumbar region, with neurogenic claudication 01/10/2013    Magdaline Zollars W. 09/12/2017, 4:35 PM  Frazier Butt., PT   [facility] [address] [place], [place], [address] Phone: [phone]   Fax:  [phone]  Name: SHERWOOD CASTILLA MRN: [id number] Date of Birth: Feb 12, 1941

## 2017-09-12 NOTE — Patient Instructions (Addendum)
WALKING  Walking is a great form of exercise to increase your strength, endurance and overall fitness.  A walking program can help you start slowly and gradually build endurance as you go.  Everyone's ability is different, so each person's starting point will be different.  You do not have to follow them exactly.  The are just samples. You should simply find out what's right for you and stick to that program.   In the beginning, you'll start off walking 2-3 times a day for short distances.  As you get stronger, you'll be walking further at just 1-2 times per day.  A. You Can Walk For A Certain Length Of Time Each Day    Walk 2 minutes 3 times per day.  Increase 1-2 minutes every 5days (3 times per day).  Work up to 8-10 minutes (1-2 times per day).   Example:   Day 1-2 2 minutes 3 times per day   Day 7-8 4-5 minutes 2-3 times per day   Day 13-1 8-10 minutes 1-2 times per day   Walk along the hallway with your wife's supervision.  -Slow down, make sure to clear your feet from the floor while you turn

## 2017-09-13 DIAGNOSIS — I10 Essential (primary) hypertension: Secondary | ICD-10-CM | POA: Diagnosis not present

## 2017-09-13 DIAGNOSIS — M961 Postlaminectomy syndrome, not elsewhere classified: Secondary | ICD-10-CM | POA: Diagnosis not present

## 2017-09-13 DIAGNOSIS — Z6831 Body mass index (BMI) 31.0-31.9, adult: Secondary | ICD-10-CM | POA: Diagnosis not present

## 2017-09-13 DIAGNOSIS — M47817 Spondylosis without myelopathy or radiculopathy, lumbosacral region: Secondary | ICD-10-CM | POA: Diagnosis not present

## 2017-09-14 ENCOUNTER — Ambulatory Visit: Payer: Medicare HMO

## 2017-09-14 DIAGNOSIS — R260 Ataxic gait: Secondary | ICD-10-CM

## 2017-09-14 DIAGNOSIS — R2689 Other abnormalities of gait and mobility: Secondary | ICD-10-CM | POA: Diagnosis not present

## 2017-09-14 DIAGNOSIS — M6281 Muscle weakness (generalized): Secondary | ICD-10-CM

## 2017-09-14 NOTE — Therapy (Signed)
[facility] [address] [place], [place], [address] Phone: [phone]   Fax:  [phone]  Physical Therapy Treatment  Patient Details  Name: John Molina MRN: [id number] Date of Birth: 08/07/1941 Referring Provider: Dr. Leta Baptist   Encounter Date: 09/14/2017  PT End of Session - 09/14/17 1148    Visit Number  3    Number of Visits  9    Date for PT Re-Evaluation  10/07/17    Authorization Type  [employer] Medicare. G-CODE AND PN EVERY 10TH VISIT.     PT Start Time  1105    PT Stop Time  1145    PT Time Calculation (min)  40 min    Equipment Utilized During Treatment  -- min guard to S prn    Activity Tolerance  Patient tolerated treatment well    Behavior During Therapy  WFL for tasks assessed/performed       Past Medical History:  Diagnosis Date  . Anemia   . Arthritis   . Chronic back pain    pain medication for 20 years  . Colon cancer ([place]) 1999  . Depression   . High triglycerides   . Hyperlipidemia   . Hypertension   . Hypothyroidism   . Neuromuscular disorder (HCC)    tremors in both arms and legs  . Sleep apnea    does not use CPAP  . Tremor     Past Surgical History:  Procedure Laterality Date  . ANTERIOR LATERAL LUMBAR FUSION WITH PERCUTANEOUS SCREW 1 LEVEL Left 12/29/2013   Procedure: LUMBAR THREE TO LUMBAR FOUR ANTERIOR LATERAL LUMBAR FUSION WITH PERCUTANEOUS SCREW 1 LEVEL;  Surgeon: Charlie Pitter, MD;  Location: [facility];  Service: Neurosurgery;  Laterality: Left;  . APPENDECTOMY    . BACK SURGERY     x 2  . BALLOON DILATION  06/27/2016   Procedure: BALLOON DILATION;  Surgeon: Danie Binder, MD;  [facility];  Service: Endoscopy;;  . CATARACT EXTRACTION     bilateral  . COLON RESECTION  08/1998   s/p chemotherapy  . COLONOSCOPY N/A 05/09/2013   Procedure: COLONOSCOPY;  Surgeon: Danie Binder, MD;  [facility];  Service: Endoscopy;  Laterality: N/A;   10:45  . COLONOSCOPY WITH PROPOFOL N/A 06/27/2016   Dr. Oneida Alar: 2 simple adenomas. Surveillance in 5 years   . ESOPHAGOGASTRODUODENOSCOPY (EGD) WITH PROPOFOL N/A 06/27/2016   Dr. Oneida Alar: normal esophagus, gastritis, pyloric stenosis s/p dilation, duodenitis  . HERNIA REPAIR Bilateral     2  . KNEE SURGERY Left   . LUMBAR LAMINECTOMY/DECOMPRESSION MICRODISCECTOMY Bilateral 01/10/2013   Procedure: LUMBAR LAMINECTOMY/DECOMPRESSION MICRODISCECTOMY 2 LEVELS;  Surgeon: Charlie Pitter, MD;  Location: [facility];  Service: Neurosurgery;  Laterality: Bilateral;  Bilateral lumbar three-four,Lumbar four-five  . POLYPECTOMY  06/27/2016   Procedure: POLYPECTOMY;  Surgeon: Danie Binder, MD;  [facility];  Service: Endoscopy;;  Ascending colon polyp, sigmoid colon polyp  . PORTACATH PLACEMENT     and removal  . SHOULDER ACROMIOPLASTY Right 11/07/2012   Procedure: SHOULDER ACROMIOPLASTY;  Surgeon: Sanjuana Kava, MD;  Location: [facility];  Service: Orthopedics;  Laterality: Right;  . SHOULDER OPEN ROTATOR CUFF REPAIR Right 11/07/2012   Procedure: ROTATOR CUFF REPAIR SHOULDER OPEN;  Surgeon: Sanjuana Kava, MD;  Location: [facility];  Service: Orthopedics;  Laterality: Right;  Right Open Rotator Cuff Repair  . VASECTOMY      There were no vitals filed for  this visit.  Subjective Assessment - 09/14/17 1108    Subjective  Pt denied falls or changes since last visit. Pt brought rollator today but was not using it.     Patient is accompained by:  Family member [place]: wife    Pertinent History  HLD, HTN, dementia, sleep apnea (pt won't use CPAP), depression, anemia, hx of colon CA in 1999-in remission, diastasis recti, spinal stenosis (lumbar), tremors, spondylolisthesis L3-L4, arthitis, hypothyroidism    Patient Stated Goals  To be able to walk straight.    Currently in Pain?  Yes    Pain Score  -- no pain when sitting down but 8/10 when walking    Pain Location  Knee patellar    Pain Orientation  Right     Pain Descriptors / Indicators  Aching    Pain Type  Chronic pain    Pain Onset  More than a month ago    Pain Frequency  Intermittent    Aggravating Factors   walking long distances    Pain Relieving Factors  sitting                           Balance Exercises - 09/14/17 1111      OTAGO PROGRAM   Head Movements  Sitting;5 reps    Back Extension  Standing;5 reps to neutral with hands on rollator    Trunk Movements  Standing;5 reps hands on rollator    Ankle Movements  Sitting;10 reps    Knee Extensor  20 reps    Knee Flexor  20 reps    Hip ABductor  20 reps    Ankle Plantorflexors  20 reps, support    Ankle Dorsiflexors  20 reps, support    Knee Bends  10 reps, support    Backwards Walking  Support    Walking and Turning Around  -- n/a    Sideways Walking  Assistive device    Tandem Stance  10 seconds, support    Overall OTAGO Comments  Pt required frequent demo and cues (verbal and tactile) for proper technique. Pt denied incr. pain during session, except for during back ext activity, PT had pt modify to neutral spine and pain decr.         PT Education - 09/14/17 1148    Education provided  Yes    Education Details  PT initiated [employer]. PT reiterated the importance of using rollator at ALL times for safety.     Person(s) Educated  Patient;Spouse    Methods  Explanation;Demonstration;Tactile cues;Verbal cues;Handout    Comprehension  Returned demonstration;Verbalized understanding;Need further instruction       PT Short Term Goals - 09/07/17 1358      PT SHORT TERM GOAL #1   Title  same as LTGs        PT Long Term Goals - 09/07/17 1358      PT LONG TERM GOAL #1   Title  Pt will perform HEP with S from wife in order to improve safety, strength, flexibility and balance. TARGET DATE FOR ALL LTGS: 10/05/17    Status  New      PT LONG TERM GOAL #2   Title  Pt and wife will verbalize understanding of fall prevention strategies to decr. falls  risk.     Status  New      PT LONG TERM GOAL #3   Title  Amb. with rollator and write goal as indicated.  Status  New      PT LONG TERM GOAL #4   Title  Pt will improve gait speed with LRAD to >/=2.62 ft/sec to safely amb. in community.     Status  New      PT LONG TERM GOAL #5   Title  Perform BERG and write goal as indicated and tolerated by pt.     Status  New            Plan - 09/14/17 1149    Clinical Impression Statement  Today's skilled session focused on initating OTAGO program to improve balance. Pt continues to require extensive cues and education on using rollator at all times for safety. Pt also required frequent cues during OTAGO. Continue with POC.     Rehab Potential  Fair    Clinical Impairments Affecting Rehab Potential  see above.     PT Frequency  2x / week    PT Duration  4 weeks    PT Treatment/Interventions  ADLs/Self Care Home Management;Biofeedback;Electrical Stimulation;Therapeutic activities;Therapeutic exercise;Manual techniques;Functional mobility training;Orthotic Fit/Training;Stair training;Gait training;Patient/family education;DME Instruction;Cognitive remediation;Neuromuscular re-education;Balance training    PT Next Visit Plan  Finish [place] and review any previous OTAGO activities pt/wife have questions about    Consulted and Agree with Plan of Care  Patient;Family member/caregiver    Family Member Consulted  pt's wife: Peter Congo       Patient will benefit from skilled therapeutic intervention in order to improve the following deficits and impairments:  Decreased endurance, Abnormal gait, Decreased knowledge of use of DME, Decreased activity tolerance, Decreased balance, Decreased mobility, Pain, Decreased cognition, Decreased range of motion, Impaired flexibility, Postural dysfunction, Decreased safety awareness, Decreased coordination, Impaired sensation  Visit Diagnosis: Other abnormalities of gait and mobility  Muscle weakness  (generalized)  Ataxic gait     Problem List Patient Active Problem List   Diagnosis Date Noted  . Nausea with vomiting 06/06/2016  . Upper abdominal pain 06/06/2016  . Spondylolisthesis at L3-L4 level 12/29/2013  . Diastasis recti 08/12/2013  . Groin strain-Left 07/08/2013  . History of colon cancer 05/06/2013  . Unspecified constipation 05/06/2013  . Anemia 05/06/2013  . Tremor 02/28/2013  . Lower back pain 02/28/2013  . Spinal stenosis, lumbar region, with neurogenic claudication 01/10/2013    Bryana Froemming L 09/14/2017, 11:52 AM  [facility] [address] [place], [place], [address] Phone: [phone]   Fax:  [phone]  Name: John Molina MRN: [id number] Date of Birth: 03-Mar-1941  Geoffry Paradise, PT,DPT 09/14/17 11:52 AM Phone: [phone] Fax: [phone]

## 2017-09-19 ENCOUNTER — Telehealth: Payer: Self-pay | Admitting: Diagnostic Neuroimaging

## 2017-09-19 NOTE — Telephone Encounter (Signed)
Pts wife called wanting to speak with Dr Leta Baptist or RN regarding her husbands MRI. Pt is aware that Dr Leta Baptist is out of the office until 09/24/17 and is wanting the call back hopefully Monday

## 2017-09-19 NOTE — Telephone Encounter (Signed)
Please call patient, MRI of the brain showed mild generalized atrophy supratentorium small vessel disease, MRI of cervical spine showed evidence of multilevel degenerative changes, prominent at C4-5, 6, where there is disc osteophyte protrusion ligamentum flavum and facet hypertrophy, moderate canal stenosis, moderate foraminal narrowing,     IMPRESSION: Abnormal MRI scan of cervical spine showing prominent spondylitic changes at C4-5 and C5-6 where there is prominent disc osteophyte protrusions along with ligamentum flavum and facet hypertrophy resulting in moderate canal and   foraminal narrowing more prominent on the left.

## 2017-09-19 NOTE — Telephone Encounter (Signed)
Patient's wife is aware of results (ok per DPR to speak with her).  States he will continue physical therapy and propranolol for tremors for now.  He will keep his pending appt in May.  She would like a call back next week if an alternate plan needs to be discussed.

## 2017-09-20 ENCOUNTER — Encounter: Payer: Self-pay | Admitting: Physical Therapy

## 2017-09-20 ENCOUNTER — Ambulatory Visit: Payer: Medicare HMO | Attending: Diagnostic Neuroimaging | Admitting: Physical Therapy

## 2017-09-20 DIAGNOSIS — R2689 Other abnormalities of gait and mobility: Secondary | ICD-10-CM | POA: Insufficient documentation

## 2017-09-20 DIAGNOSIS — M6281 Muscle weakness (generalized): Secondary | ICD-10-CM

## 2017-09-20 NOTE — Therapy (Signed)
[facility] [address] [place], [place], [address] Phone: [phone]   Fax:  [phone]  Physical Therapy Treatment  Patient Details  Name: John Molina MRN: [id number] Date of Birth: Jan 20, 1941 Referring Provider: Dr. Leta Baptist   Encounter Date: 09/20/2017  PT End of Session - 09/20/17 0947    Visit Number  4    Number of Visits  9    Date for PT Re-Evaluation  10/07/17    Authorization Type  [employer] Medicare. G-CODE AND PN EVERY 10TH VISIT.     PT Start Time  [phone]    PT Stop Time  0930    PT Time Calculation (min)  41 min    Equipment Utilized During Treatment  Gait belt min guard to S prn    Activity Tolerance  Patient tolerated treatment well    Behavior During Therapy  WFL for tasks assessed/performed       Past Medical History:  Diagnosis Date  . Anemia   . Arthritis   . Chronic back pain    pain medication for 20 years  . Colon cancer ([place]) 1999  . Depression   . High triglycerides   . Hyperlipidemia   . Hypertension   . Hypothyroidism   . Neuromuscular disorder (HCC)    tremors in both arms and legs  . Sleep apnea    does not use CPAP  . Tremor     Past Surgical History:  Procedure Laterality Date  . ANTERIOR LATERAL LUMBAR FUSION WITH PERCUTANEOUS SCREW 1 LEVEL Left 12/29/2013   Procedure: LUMBAR THREE TO LUMBAR FOUR ANTERIOR LATERAL LUMBAR FUSION WITH PERCUTANEOUS SCREW 1 LEVEL;  Surgeon: Charlie Pitter, MD;  Location: [facility];  Service: Neurosurgery;  Laterality: Left;  . APPENDECTOMY    . BACK SURGERY     x 2  . BALLOON DILATION  06/27/2016   Procedure: BALLOON DILATION;  Surgeon: Danie Binder, MD;  [facility];  Service: Endoscopy;;  . CATARACT EXTRACTION     bilateral  . COLON RESECTION  08/1998   s/p chemotherapy  . COLONOSCOPY N/A 05/09/2013   Procedure: COLONOSCOPY;  Surgeon: Danie Binder, MD;  [facility];  Service: Endoscopy;  Laterality: N/A;   10:45  . COLONOSCOPY WITH PROPOFOL N/A 06/27/2016   Dr. Oneida Alar: 2 simple adenomas. Surveillance in 5 years   . ESOPHAGOGASTRODUODENOSCOPY (EGD) WITH PROPOFOL N/A 06/27/2016   Dr. Oneida Alar: normal esophagus, gastritis, pyloric stenosis s/p dilation, duodenitis  . HERNIA REPAIR Bilateral     2  . KNEE SURGERY Left   . LUMBAR LAMINECTOMY/DECOMPRESSION MICRODISCECTOMY Bilateral 01/10/2013   Procedure: LUMBAR LAMINECTOMY/DECOMPRESSION MICRODISCECTOMY 2 LEVELS;  Surgeon: Charlie Pitter, MD;  Location: [facility];  Service: Neurosurgery;  Laterality: Bilateral;  Bilateral lumbar three-four,Lumbar four-five  . POLYPECTOMY  06/27/2016   Procedure: POLYPECTOMY;  Surgeon: Danie Binder, MD;  [facility];  Service: Endoscopy;;  Ascending colon polyp, sigmoid colon polyp  . PORTACATH PLACEMENT     and removal  . SHOULDER ACROMIOPLASTY Right 11/07/2012   Procedure: SHOULDER ACROMIOPLASTY;  Surgeon: Sanjuana Kava, MD;  Location: [facility];  Service: Orthopedics;  Laterality: Right;  . SHOULDER OPEN ROTATOR CUFF REPAIR Right 11/07/2012   Procedure: ROTATOR CUFF REPAIR SHOULDER OPEN;  Surgeon: Sanjuana Kava, MD;  Location: [facility];  Service: Orthopedics;  Laterality: Right;  Right Open Rotator Cuff Repair  . VASECTOMY      There were no vitals filed  for this visit.  Subjective Assessment - 09/20/17 0851    Subjective  No falls, almost fell but caught myself.  Pt using rollator coming into therapy today, but wife reports he hasn't used since last therapy session.  Been doing exercises with wife.    Patient is accompained by:  Family member [place]: wife    Pertinent History  HLD, HTN, dementia, sleep apnea (pt won't use CPAP), depression, anemia, hx of colon CA in 1999-in remission, diastasis recti, spinal stenosis (lumbar), tremors, spondylolisthesis L3-L4, arthitis, hypothyroidism    Patient Stated Goals  To be able to walk straight.    Currently in Pain?  Yes    Pain Score  3     Pain Location   Back    Pain Orientation  Lower;Right;Left    Pain Descriptors / Indicators  Aching    Pain Onset  More than a month ago    Pain Frequency  Intermittent    Aggravating Factors   getting up    Pain Relieving Factors  sitting                      [facility] Adult PT Treatment/Exercise - 09/20/17 0853      Transfers   Transfers  Sit to Stand;Stand to Sit    Sit to Stand  5: Supervision;With upper extremity assist;From chair/3-in-1;From bed    Stand to Sit  5: Supervision;With upper extremity assist;To chair/3-in-1;To bed    Comments  Cues for hand placement, as pt tends to use hands on rollator to stand up.          Balance Exercises - 09/20/17 0855      OTAGO PROGRAM   Head Movements  Standing;5 reps    Back Extension  Standing;5 reps to neutral with hands on rollator    Trunk Movements  Standing;5 reps hands on rollator    Ankle Movements  Sitting;10 reps Cues for correct technique    Knee Extensor  20 reps    Knee Flexor  20 reps    Hip ABductor  20 reps cues for upright posture    Ankle Plantorflexors  20 reps, support    Ankle Dorsiflexors  20 reps, support    Knee Bends  10 reps, support    Backwards Walking  Support forward/back walking at counter    Sideways Walking  Assistive device    Tandem Stance  -- 3 reps with UE support at counter    Tandem Walk  Support    One Leg Stand  10 seconds, support    Sit to Stand  10 reps, bilateral support    Overall OTAGO Comments  Pt required frequent cues for correct technique, especially for ankle pumps sitting position.  Added SLS and sit<>stand to HEP; other exercises do not appear appropriate or safe for pt at this time.        PT Education - 09/20/17 0945    Education provided  Yes    Education Details  Added to OTAGO HEP; againg PT reiterated importance of using rollator walker at all times    Person(s) Educated  Patient;Spouse    Methods  Explanation;Demonstration;Verbal cues;Handout    Comprehension   Verbalized understanding;Returned demonstration;Need further instruction       PT Short Term Goals - 09/07/17 1358      PT SHORT TERM GOAL #1   Title  same as LTGs        PT Long Term Goals - 09/07/17 1358  PT LONG TERM GOAL #1   Title  Pt will perform HEP with S from wife in order to improve safety, strength, flexibility and balance. TARGET DATE FOR ALL LTGS: 10/05/17    Status  New      PT LONG TERM GOAL #2   Title  Pt and wife will verbalize understanding of fall prevention strategies to decr. falls risk.     Status  New      PT LONG TERM GOAL #3   Title  Amb. with rollator and write goal as indicated.     Status  New      PT LONG TERM GOAL #4   Title  Pt will improve gait speed with LRAD to >/=2.62 ft/sec to safely amb. in community.     Status  New      PT LONG TERM GOAL #5   Title  Perform BERG and write goal as indicated and tolerated by pt.     Status  New            Plan - 09/20/17 0949    Clinical Impression Statement  Today's skilled PT session focused on reviewing and adding appropriate exercises to OTAGO HEP.  Not all exercises added due to safety concerns.  Pt continues to require cues for correct performance of exercises (wife reports assisting wife at home)  and for hand placement with transfers.  Pt reportedly not using rollator walker, other than to come into therapy.  Pt continues to demonstrate lower extremity tremor in standing activities and with seated open chain activities.    Rehab Potential  Fair    Clinical Impairments Affecting Rehab Potential  see above.     PT Frequency  2x / week    PT Duration  4 weeks    PT Treatment/Interventions  ADLs/Self Care Home Management;Biofeedback;Electrical Stimulation;Therapeutic activities;Therapeutic exercise;Manual techniques;Functional mobility training;Orthotic Fit/Training;Stair training;Gait training;Patient/family education;DME Instruction;Cognitive remediation;Neuromuscular re-education;Balance  training    PT Next Visit Plan  Review any new OTAGO exercises as part of HEP; discuss fall prevention, including safety education in shower (per patient request)    Consulted and Agree with Plan of Care  Patient;Family member/caregiver    Family Member Consulted  pt's wife: Peter Congo       Patient will benefit from skilled therapeutic intervention in order to improve the following deficits and impairments:  Decreased endurance, Abnormal gait, Decreased knowledge of use of DME, Decreased activity tolerance, Decreased balance, Decreased mobility, Pain, Decreased cognition, Decreased range of motion, Impaired flexibility, Postural dysfunction, Decreased safety awareness, Decreased coordination, Impaired sensation  Visit Diagnosis: Muscle weakness (generalized)  Other abnormalities of gait and mobility     Problem List Patient Active Problem List   Diagnosis Date Noted  . Nausea with vomiting 06/06/2016  . Upper abdominal pain 06/06/2016  . Spondylolisthesis at L3-L4 level 12/29/2013  . Diastasis recti 08/12/2013  . Groin strain-Left 07/08/2013  . History of colon cancer 05/06/2013  . Unspecified constipation 05/06/2013  . Anemia 05/06/2013  . Tremor 02/28/2013  . Lower back pain 02/28/2013  . Spinal stenosis, lumbar region, with neurogenic claudication 01/10/2013    Shaida Route W. 09/20/2017, 9:55 AM  Frazier Butt., PT   [facility] [address] [place], [place], [address] Phone: [phone]   Fax:  [phone]  Name: John Molina MRN: [id number] Date of Birth: 03/01/1941

## 2017-09-25 ENCOUNTER — Encounter: Payer: Self-pay | Admitting: Physical Therapy

## 2017-09-25 ENCOUNTER — Ambulatory Visit: Payer: Medicare HMO | Admitting: Physical Therapy

## 2017-09-25 DIAGNOSIS — M6281 Muscle weakness (generalized): Secondary | ICD-10-CM | POA: Diagnosis not present

## 2017-09-25 DIAGNOSIS — R2689 Other abnormalities of gait and mobility: Secondary | ICD-10-CM | POA: Diagnosis not present

## 2017-09-25 NOTE — Therapy (Signed)
[facility] [address] [place], [place], [address] Phone: [phone]   Fax:  [phone]  Physical Therapy Treatment  Patient Details  Name: John Molina MRN: [id number] Date of Birth: [birth year] Referring Provider: Dr. Leta Baptist   Encounter Date: 09/25/2017  PT End of Session - 09/25/17 1421    Visit Number  5    Number of Visits  9    Date for PT Re-Evaluation  10/07/17    Authorization Type  [employer] Medicare. G-CODE AND PN EVERY 10TH VISIT.     PT Start Time  1317    PT Stop Time  1401    PT Time Calculation (min)  44 min    Equipment Utilized During Treatment  Gait belt min guard to S prn    Activity Tolerance  Patient tolerated treatment well    Behavior During Therapy  WFL for tasks assessed/performed       Past Medical History:  Diagnosis Date  . Anemia   . Arthritis   . Chronic back pain    pain medication for 20 years  . Colon cancer ([place]) 1999  . Depression   . High triglycerides   . Hyperlipidemia   . Hypertension   . Hypothyroidism   . Neuromuscular disorder (HCC)    tremors in both arms and legs  . Sleep apnea    does not use CPAP  . Tremor     Past Surgical History:  Procedure Laterality Date  . ANTERIOR LATERAL LUMBAR FUSION WITH PERCUTANEOUS SCREW 1 LEVEL Left 12/29/2013   Procedure: LUMBAR THREE TO LUMBAR FOUR ANTERIOR LATERAL LUMBAR FUSION WITH PERCUTANEOUS SCREW 1 LEVEL;  Surgeon: Charlie Pitter, MD;  Location: [facility];  Service: Neurosurgery;  Laterality: Left;  . APPENDECTOMY    . BACK SURGERY     x 2  . BALLOON DILATION  06/27/2016   Procedure: BALLOON DILATION;  Surgeon: Danie Binder, MD;  [facility];  Service: Endoscopy;;  . CATARACT EXTRACTION     bilateral  . COLON RESECTION  08/1998   s/p chemotherapy  . COLONOSCOPY N/A 05/09/2013   Procedure: COLONOSCOPY;  Surgeon: Danie Binder, MD;  [facility];  Service: Endoscopy;  Laterality: N/A;   10:45  . COLONOSCOPY WITH PROPOFOL N/A 06/27/2016   Dr. Oneida Alar: 2 simple adenomas. Surveillance in 5 years   . ESOPHAGOGASTRODUODENOSCOPY (EGD) WITH PROPOFOL N/A 06/27/2016   Dr. Oneida Alar: normal esophagus, gastritis, pyloric stenosis s/p dilation, duodenitis  . HERNIA REPAIR Bilateral     2  . KNEE SURGERY Left   . LUMBAR LAMINECTOMY/DECOMPRESSION MICRODISCECTOMY Bilateral 01/10/2013   Procedure: LUMBAR LAMINECTOMY/DECOMPRESSION MICRODISCECTOMY 2 LEVELS;  Surgeon: Charlie Pitter, MD;  Location: [facility];  Service: Neurosurgery;  Laterality: Bilateral;  Bilateral lumbar three-four,Lumbar four-five  . POLYPECTOMY  06/27/2016   Procedure: POLYPECTOMY;  Surgeon: Danie Binder, MD;  [facility];  Service: Endoscopy;;  Ascending colon polyp, sigmoid colon polyp  . PORTACATH PLACEMENT     and removal  . SHOULDER ACROMIOPLASTY Right 11/07/2012   Procedure: SHOULDER ACROMIOPLASTY;  Surgeon: Sanjuana Kava, MD;  Location: [facility];  Service: Orthopedics;  Laterality: Right;  . SHOULDER OPEN ROTATOR CUFF REPAIR Right 11/07/2012   Procedure: ROTATOR CUFF REPAIR SHOULDER OPEN;  Surgeon: Sanjuana Kava, MD;  Location: [facility];  Service: Orthopedics;  Laterality: Right;  Right Open Rotator Cuff Repair  . VASECTOMY      There were no vitals filed  for this visit.  Subjective Assessment - 09/25/17 1320    Subjective  Had a fall today when my feet hit the shower chair, trying to stand when closing eyes to wash hair.  Don't want to use the shower chair to sit.  Have been doing exercises with wife.    Patient is accompained by:  Family member [place]: wife    Pertinent History  HLD, HTN, dementia, sleep apnea (pt won't use CPAP), depression, anemia, hx of colon CA in 1999-in remission, diastasis recti, spinal stenosis (lumbar), tremors, spondylolisthesis L3-L4, arthitis, hypothyroidism    Patient Stated Goals  To be able to walk straight.    Currently in Pain?  Yes    Pain Score  3     Pain Location   Back    Pain Orientation  Right;Left;Lower    Pain Descriptors / Indicators  Aching    Pain Type  Chronic pain    Pain Onset  More than a month ago    Pain Frequency  Intermittent    Aggravating Factors   getting up    Pain Relieving Factors  sitting                      [facility] Adult PT Treatment/Exercise - 09/25/17 0001      Transfers   Transfers  Sit to Stand;Stand to Sit    Sit to Stand  5: Supervision;With upper extremity assist;From chair/3-in-1;From bed    Stand to Sit  5: Supervision;With upper extremity assist;To chair/3-in-1;To bed    Comments  Better hand placement, still needs cues on how to lock rollator correctly.      Ambulation/Gait   Ambulation/Gait  Yes    Ambulation/Gait Assistance  4: Min guard    Ambulation Distance (Feet)  115 Feet then 230 ft; 20 ft x 4 reps    Assistive device  Rollator    Gait Pattern  Step-through pattern;Decreased stride length;Decreased dorsiflexion - left;Decreased dorsiflexion - right;Decreased hip/knee flexion - right;Decreased hip/knee flexion - left;Ataxic;Wide base of support Improves heelstrike, foot clearance, step length with cues      Self-Care   Self-Care  Other Self-Care Comments    Other Self-Care Comments   Fall prevention education provided to patient/wife; specifically talked about bathroom safety in regards to recent fall in shower; discussed pt's tremors, and recommended that if tremors are increasing/worsening, that pt/wife should consider letting neurologist know and trying to make sooner appointment.      Exercises   Exercises  Ankle      Ankle Exercises: Stretches   Other Stretch  Attempted seated hamstring stretch, foot propped on floor and foot propped on 4" block, with pt experiencing increased leg tremors.      Ankle Exercises: Seated   Other Seated Ankle Exercises  seated heelslides x 3 reps, RLE with cues for keeping heel on the ground; then seated towel stretch into R ankle dorsiflexion 3 reps x  30 seconds          Balance Exercises - 09/25/17 1410      Balance Exercises: Standing   Standing Eyes Opened  Wide (BOA);Narrow base of support (BOS);Head turns;Solid surface;5 reps Head nods,1-2 UE supported at locked rollator    Standing Eyes Closed  Wide (BOA);Narrow base of support (BOS);Solid surface;Head turns;5 reps Head nods, 1-2 UE support at locked rollator      OTAGO PROGRAM   Tandem Walk  Support    One Leg Stand  10 seconds, support  Sit to Stand  5 reps, one support    Overall OTAGO Comments  Reviewed additions to HEP; pt appears to have more tremors with above exercises today, with pt needing cues for UE support for safety with increased tremors.     With standing exercises in corner, PT provides cues for widened BOS with lateral weightshifting, 5-10 reps, x at least 3 sets, which appears to decrease intensity of lower extremity tremor.   PT Education - 09/25/17 1419    Education provided  Yes    Education Details  Review of HEP; cues for lateral weightshift with standing to decrease lower extremity tremor; fall prevention education    Person(s) Educated  Patient;Spouse    Methods  Explanation;Demonstration;Handout    Comprehension  Verbalized understanding;Returned demonstration       PT Short Term Goals - 09/07/17 1358      PT SHORT TERM GOAL #1   Title  same as LTGs        PT Long Term Goals - 09/25/17 1425      PT LONG TERM GOAL #1   Title  Pt will perform HEP with S from wife in order to improve safety, strength, flexibility and balance. TARGET DATE FOR ALL LTGS: 10/05/17    Status  New      PT LONG TERM GOAL #2   Title  Pt and wife will verbalize understanding of fall prevention strategies to decr. falls risk.     Status  New      PT LONG TERM GOAL #3   Title  Amb. with rollator and write goal as indicated. Updated goal:  Pt will ambulate with rollator walker, at least 225 ft in  28minutes, for improved efficiency and safety with gait.     Status  New      PT LONG TERM GOAL #4   Title  Pt will improve gait speed with LRAD to >/=2.62 ft/sec to safely amb. in community.     Status  New      PT LONG TERM GOAL #5   Title  Perform BERG and write goal as indicated and tolerated by pt.     Baseline  No BERG goal, as with multiple attempts, pt unable to stand unsupported without increase in lower extremity tremor.    Status  Deferred            Plan - 09/25/17 1422    Clinical Impression Statement  Pt noted to have increased tremors with tandem and single limb stance, sit<>stand and attempts at seated lower extremity stretches today.  With corner balance exercises with UE support, pt's lower extremity tremors are begin, but can be lessed by lateral weightshifting.  Pt abl to take increased step length and improved foot clearance and heelstrike with verbal cues during gait activities.    Rehab Potential  Fair    Clinical Impairments Affecting Rehab Potential  see above.     PT Frequency  2x / week    PT Duration  4 weeks    PT Treatment/Interventions  ADLs/Self Care Home Management;Biofeedback;Electrical Stimulation;Therapeutic activities;Therapeutic exercise;Manual techniques;Functional mobility training;Orthotic Fit/Training;Stair training;Gait training;Patient/family education;DME Instruction;Cognitive remediation;Neuromuscular re-education;Balance training    PT Next Visit Plan  Review fall prevention education; corner balance exercises and balance exercises to emphasize foot clearance; gait training with rollator    Consulted and Agree with Plan of Care  Patient;Family member/caregiver    Family Member Consulted  pt's wife: Peter Congo       Patient will benefit  from skilled therapeutic intervention in order to improve the following deficits and impairments:  Decreased endurance, Abnormal gait, Decreased knowledge of use of DME, Decreased activity tolerance, Decreased balance, Decreased mobility, Pain, Decreased cognition,  Decreased range of motion, Impaired flexibility, Postural dysfunction, Decreased safety awareness, Decreased coordination, Impaired sensation  Visit Diagnosis: Muscle weakness (generalized)  Other abnormalities of gait and mobility     Problem List Patient Active Problem List   Diagnosis Date Noted  . Nausea with vomiting 06/06/2016  . Upper abdominal pain 06/06/2016  . Spondylolisthesis at L3-L4 level 12/29/2013  . Diastasis recti 08/12/2013  . Groin strain-Left 07/08/2013  . History of colon cancer 05/06/2013  . Unspecified constipation 05/06/2013  . Anemia 05/06/2013  . Tremor 02/28/2013  . Lower back pain 02/28/2013  . Spinal stenosis, lumbar region, with neurogenic claudication 01/10/2013    Eshaan Titzer W. 09/25/2017, 2:29 PM Frazier Butt., PT  [facility] [address] [place], [place], [address] Phone: [phone]   Fax:  [phone]  Name: John Molina MRN: [id number] Date of Birth: 17-Mar-1941

## 2017-09-27 ENCOUNTER — Telehealth: Payer: Self-pay | Admitting: Diagnostic Neuroimaging

## 2017-09-27 ENCOUNTER — Ambulatory Visit: Payer: Medicare HMO

## 2017-09-27 NOTE — Telephone Encounter (Signed)
Patient requesting sooner apt than scheduled in May, nothing available Jan-February and wife insisting he needs to be seen ASAP. Best call back [phone]

## 2017-09-27 NOTE — Therapy (Signed)
[facility] [address] [place], [place], [address] Phone: [phone]   Fax:  [phone]  Physical Therapy Treatment  Patient Details  Name: John Molina MRN: [id number] Date of Birth: 17-Jan-1941 Referring Provider: Dr. Leta Baptist   Encounter Date: 09/27/2017  PT End of Session - 09/27/17 1552    Visit Number  5 no charge    Number of Visits  9    Date for PT Re-Evaluation  10/07/17    Authorization Type  [employer] Medicare.     PT Start Time  1535    PT Stop Time  1547 no charge    PT Time Calculation (min)  12 min    Activity Tolerance  Treatment limited secondary to medical complications (Comment) incr. tremors    Behavior During Therapy  WFL for tasks assessed/performed       Past Medical History:  Diagnosis Date  . Anemia   . Arthritis   . Chronic back pain    pain medication for 20 years  . Colon cancer ([place]) 1999  . Depression   . High triglycerides   . Hyperlipidemia   . Hypertension   . Hypothyroidism   . Neuromuscular disorder (HCC)    tremors in both arms and legs  . Sleep apnea    does not use CPAP  . Tremor     Past Surgical History:  Procedure Laterality Date  . ANTERIOR LATERAL LUMBAR FUSION WITH PERCUTANEOUS SCREW 1 LEVEL Left 12/29/2013   Procedure: LUMBAR THREE TO LUMBAR FOUR ANTERIOR LATERAL LUMBAR FUSION WITH PERCUTANEOUS SCREW 1 LEVEL;  Surgeon: Charlie Pitter, MD;  Location: [facility];  Service: Neurosurgery;  Laterality: Left;  . APPENDECTOMY    . BACK SURGERY     x 2  . BALLOON DILATION  06/27/2016   Procedure: BALLOON DILATION;  Surgeon: Danie Binder, MD;  [facility];  Service: Endoscopy;;  . CATARACT EXTRACTION     bilateral  . COLON RESECTION  08/1998   s/p chemotherapy  . COLONOSCOPY N/A 05/09/2013   Procedure: COLONOSCOPY;  Surgeon: Danie Binder, MD;  [facility];  Service: Endoscopy;  Laterality: N/A;  10:45  . COLONOSCOPY WITH PROPOFOL  N/A 06/27/2016   Dr. Oneida Alar: 2 simple adenomas. Surveillance in 5 years   . ESOPHAGOGASTRODUODENOSCOPY (EGD) WITH PROPOFOL N/A 06/27/2016   Dr. Oneida Alar: normal esophagus, gastritis, pyloric stenosis s/p dilation, duodenitis  . HERNIA REPAIR Bilateral     2  . KNEE SURGERY Left   . LUMBAR LAMINECTOMY/DECOMPRESSION MICRODISCECTOMY Bilateral 01/10/2013   Procedure: LUMBAR LAMINECTOMY/DECOMPRESSION MICRODISCECTOMY 2 LEVELS;  Surgeon: Charlie Pitter, MD;  Location: [facility];  Service: Neurosurgery;  Laterality: Bilateral;  Bilateral lumbar three-four,Lumbar four-five  . POLYPECTOMY  06/27/2016   Procedure: POLYPECTOMY;  Surgeon: Danie Binder, MD;  [facility];  Service: Endoscopy;;  Ascending colon polyp, sigmoid colon polyp  . PORTACATH PLACEMENT     and removal  . SHOULDER ACROMIOPLASTY Right 11/07/2012   Procedure: SHOULDER ACROMIOPLASTY;  Surgeon: Sanjuana Kava, MD;  Location: [facility];  Service: Orthopedics;  Laterality: Right;  . SHOULDER OPEN ROTATOR CUFF REPAIR Right 11/07/2012   Procedure: ROTATOR CUFF REPAIR SHOULDER OPEN;  Surgeon: Sanjuana Kava, MD;  Location: [facility];  Service: Orthopedics;  Laterality: Right;  Right Open Rotator Cuff Repair  . VASECTOMY      There were no vitals filed for this visit.  Subjective Assessment - 09/27/17 1551  Subjective  Pt and pt's wife reported he's having a bad day today and tremors are getting worse. Admin asst. reported pt almost fell in the lobby while checking in.     Patient is accompained by:  Family member    Pertinent History  HLD, HTN, dementia, sleep apnea (pt won't use CPAP), depression, anemia, hx of colon CA in 1999-in remission, diastasis recti, spinal stenosis (lumbar), tremors, spondylolisthesis L3-L4, arthitis, hypothyroidism    Patient Stated Goals  To be able to walk straight.                                PT Short Term Goals - 09/07/17 1358      PT SHORT TERM GOAL #1   Title  same as  LTGs        PT Long Term Goals - 09/27/17 1558      PT LONG TERM GOAL #1   Title  Pt will perform HEP with S from wife in order to improve safety, strength, flexibility and balance. TARGET DATE FOR ALL LTGS: 10/05/17    Baseline  no goals met, as pt not safe to perform OPPT today 2/2 incr. tremors during all activity (txfs, standing, amb.)    Status  Not Met      PT LONG TERM GOAL #2   Title  Pt and wife will verbalize understanding of fall prevention strategies to decr. falls risk.     Status  Not Met      PT LONG TERM GOAL #3   Title  Amb. with rollator and write goal as indicated. Updated goal:  Pt will ambulate with rollator walker, at least 225 ft in  54mnutes, for improved efficiency and safety with gait.    Status  Not Met      PT LONG TERM GOAL #4   Title  Pt will improve gait speed with LRAD to >/=2.62 ft/sec to safely amb. in community.     Status  Not Met      PT LONG TERM GOAL #5   Title  Perform BERG and write goal as indicated and tolerated by pt.     Baseline  No BERG goal, as with multiple attempts, pt unable to stand unsupported without increase in lower extremity tremor.    Status  Not Met            Plan - 09/27/17 1555    Clinical Impression Statement  No charge for today's visit, as pt had 2 near falls at PT today. Once, pt almost fell when checking and once during amb. back to therapy room 2/2 incr. body tremors. Pt's wife stated pt's tremors are getting worse and he's having difficulty bathing, transfering, walking, and dressing. PT requested pt f/u with neurologist and recommended HHPT at this time, as pt and pt's wife have a very difficult time safely getting pt to OPPT. PT will send note to MD. PT unable to assess goals today 2/2 worsening tremors and severely impaired balance 2/2 tremors.     Rehab Potential  Fair    Clinical Impairments Affecting Rehab Potential  see above.     PT Frequency  2x / week    PT Duration  4 weeks    PT  Treatment/Interventions  ADLs/Self Care Home Management;Biofeedback;Electrical Stimulation;Therapeutic activities;Therapeutic exercise;Manual techniques;Functional mobility training;Orthotic Fit/Training;Stair training;Gait training;Patient/family education;DME Instruction;Cognitive remediation;Neuromuscular re-education;Balance training    PT Next Visit Plan  d/c and recommended  HHPT once pt f/u with neurologist     Consulted and Agree with Plan of Care  Patient;Family member/caregiver    Family Member Consulted  pt's wife: Peter Congo       Patient will benefit from skilled therapeutic intervention in order to improve the following deficits and impairments:  Decreased endurance, Abnormal gait, Decreased knowledge of use of DME, Decreased activity tolerance, Decreased balance, Decreased mobility, Pain, Decreased cognition, Decreased range of motion, Impaired flexibility, Postural dysfunction, Decreased safety awareness, Decreased coordination, Impaired sensation  Visit Diagnosis: Other abnormalities of gait and mobility     Problem List Patient Active Problem List   Diagnosis Date Noted  . Nausea with vomiting 06/06/2016  . Upper abdominal pain 06/06/2016  . Spondylolisthesis at L3-L4 level 12/29/2013  . Diastasis recti 08/12/2013  . Groin strain-Left 07/08/2013  . History of colon cancer 05/06/2013  . Unspecified constipation 05/06/2013  . Anemia 05/06/2013  . Tremor 02/28/2013  . Lower back pain 02/28/2013  . Spinal stenosis, lumbar region, with neurogenic claudication 01/10/2013    Miller,Jennifer L 09/27/2017, 3:59 PM  [facility] [address] [place], [place], [address] Phone: [phone]   Fax:  [phone]  Name: John Molina MRN: [id number] Date of Birth: [birth year]  PHYSICAL THERAPY DISCHARGE SUMMARY  Visits from Start of Care: 5  Current functional level related to goals / functional outcomes: PT Long  Term Goals - 09/27/17 1558      PT LONG TERM GOAL #1   Title  Pt will perform HEP with S from wife in order to improve safety, strength, flexibility and balance. TARGET DATE FOR ALL LTGS: 10/05/17    Baseline  no goals met, as pt not safe to perform OPPT today 2/2 incr. tremors during all activity (txfs, standing, amb.)    Status  Not Met      PT LONG TERM GOAL #2   Title  Pt and wife will verbalize understanding of fall prevention strategies to decr. falls risk.     Status  Not Met      PT LONG TERM GOAL #3   Title  Amb. with rollator and write goal as indicated. Updated goal:  Pt will ambulate with rollator walker, at least 225 ft in  58mnutes, for improved efficiency and safety with gait.    Status  Not Met      PT LONG TERM GOAL #4   Title  Pt will improve gait speed with LRAD to >/=2.62 ft/sec to safely amb. in community.     Status  Not Met      PT LONG TERM GOAL #5   Title  Perform BERG and write goal as indicated and tolerated by pt.     Baseline  No BERG goal, as with multiple attempts, pt unable to stand unsupported without increase in lower extremity tremor.    Status  Not Met         Remaining deficits: PT discharging pt from therapy at this time, as pt's tremors are worsening and severely limiting pt's safety during ADLs and all mobility. Pt experienced two near falls while in lobby and amb. Back to therapy room today. PT recommends HHPT and f/u with neurologist at this time.    Education / Equipment: HEP and rollator  Plan: Patient agrees to discharge.  Patient goals were not met. Patient is being discharged due to a change in medical status.  ?????          JAnderson Malta  Sabra Heck, PT,DPT 09/27/17 4:01 PM Phone: [phone] Fax: [phone]

## 2017-09-27 NOTE — Telephone Encounter (Signed)
Ok to see me soon. -VRP

## 2017-09-27 NOTE — Telephone Encounter (Signed)
Spoke to pts wife.  Pt had hard time at [facility] today, see jennifer, PT note.  Recommending HHPT.  Wife states worsening tremors, hard time ambulating.  Had falls today at therapy.  She is asking for a sooner appt.   Please advise.

## 2017-09-28 NOTE — Telephone Encounter (Signed)
Spoke to wife and made appt for 10-01-17 at 1300.  She verbalized understanding.

## 2017-10-01 ENCOUNTER — Encounter: Payer: Self-pay | Admitting: Diagnostic Neuroimaging

## 2017-10-01 ENCOUNTER — Ambulatory Visit: Payer: Medicare HMO | Admitting: Diagnostic Neuroimaging

## 2017-10-01 VITALS — BP 184/82 | HR 56 | Ht 67.0 in | Wt 202.0 lb

## 2017-10-01 DIAGNOSIS — M4726 Other spondylosis with radiculopathy, lumbar region: Secondary | ICD-10-CM

## 2017-10-01 DIAGNOSIS — M4802 Spinal stenosis, cervical region: Secondary | ICD-10-CM | POA: Diagnosis not present

## 2017-10-01 DIAGNOSIS — M542 Cervicalgia: Secondary | ICD-10-CM | POA: Diagnosis not present

## 2017-10-01 DIAGNOSIS — R413 Other amnesia: Secondary | ICD-10-CM | POA: Diagnosis not present

## 2017-10-01 NOTE — Patient Instructions (Addendum)
  WALKING DIFFICULTY / BALANCE  - consider follow up with neurosurgery (Dr. Annette Stable) re: cervical spinal stenosis and lumbar spinal stenosis - consider home health physical therapy evaluation (with social work and nursing) - use rollator walker; may need wheelchair  MEMORY LOSS - brain healthy activities reviewed - no driving

## 2017-10-01 NOTE — Progress Notes (Signed)
[facility]  PATIENT: John Molina DOB: 25-Feb-1941  REFERRING CLINICIAN: Merlyn Albert, MD HISTORY FROM: patient and wife REASON FOR VISIT: follow up   HISTORICAL  CHIEF COMPLAINT:  Chief Complaint  Patient presents with  . Follow-up  . worsening tremors and falls    has good days and bad days (last thursday in therapy had 2 falls)  Having hallucinations too.  Today is better day.     HISTORY OF PRESENT ILLNESS:   UPDATE (10/01/17, VRP): Since last visit, doing slightly worse. Tried PT, but could not get back and forth and was increasingly falling. MRI scans reviewed. No alleviating or aggravating factors. Having more problems with getting dressed and other ADLs.   PRIOR HPI (08/20/17): 77 year old male with hypertension, hyperglycemia, anxiety, here for evaluation of tremors, gait difficulty, lower semi-numbness, memory loss.  Patient has had gradual onset progressive short-term memory loss, confusion, forgetting conversations, forgetting recent events, having difficulty taking his medications, at least since 2015.  Symptoms are getting worse over time.  Patient tried Aricept but this caused nausea.  Now patient on memantine 5 mg twice a day.  Patient also has had some intermittent hallucinations, visual disturbance, seeing people and objects that are not there, seeing movement of objects when it is not occurring.  Patient also having low back pain problems since 2013.  He underwent low back surgery in 2014 and 2015.  However patient continues to have back pain, numbness and tingling in his feet and legs, gait and balance difficulty.  Since 2014 he has had intermittent sudden onset leg tremors when standing up or exerting himself.  He has had multiple falls in the last few months.  He declines to use a cane or walker.  He has not done physical therapy recently.  He has a mainly sedentary lifestyle lately.  Patient also has some intermittent tremors in his upper  extremities.  Patient has some neck pain.    REVIEW OF SYSTEMS: Full 14 system review of systems performed and negative with exception of: Memory loss confusion numbness difficulty swallowing snoring depression anxiety too much sleep urination problems constipation hearing loss ringing in ears trouble swallowing.  ALLERGIES: Allergies  Allergen Reactions  . Morphine And Related     Pt. States that it does not work for him    HOME MEDICATIONS: Outpatient Medications Prior to Visit  Medication Sig Dispense Refill  . amphetamine-dextroamphetamine (ADDERALL) 30 MG tablet Take 30 mg by mouth daily with breakfast.     . buPROPion (WELLBUTRIN XL) 300 MG 24 hr tablet 300 mg daily.    . citalopram (CELEXA) 40 MG tablet Take 40 mg by mouth daily.    . cloNIDine (CATAPRES) 0.2 MG tablet 0.2 mg 2 (two) times daily.     Marland Kitchen HYDROmorphone (DILAUDID) 8 MG tablet Take 8 mg by mouth 3 (three) times daily.     Marland Kitchen levothyroxine (SYNTHROID, LEVOTHROID) 50 MCG tablet 50 mcg daily.    Marland Kitchen linaclotide (LINZESS) 145 MCG CAPS capsule Take 1 capsule (145 mcg total) by mouth daily before breakfast. 30 capsule 5  . losartan (COZAAR) 100 MG tablet Take 100 mg by mouth daily.    . memantine (NAMENDA) 10 MG tablet 5 mg 2 (two) times daily.    . mupirocin ointment (BACTROBAN) 2 %     . omeprazole (PRILOSEC) 20 MG capsule TAKE 1 CAPSULE BY MOUTH 30 MINUTES BEFORE BREAKFAST 90 capsule 3  . oxymorphone (OPANA ER) 30 MG 12 hr tablet Take  30 mg by mouth daily.     . propranolol (INDERAL) 40 MG tablet Take 40 mg by mouth 2 (two) times daily.    . rosuvastatin (CRESTOR) 5 MG tablet Take 5 mg by mouth daily.    . sodium-potassium bicarbonate (ALKA-SELTZER GOLD) TBEF dissolvable tablet Take 1 tablet by mouth daily as needed.    . testosterone (ANDROGEL) 50 MG/5GM (1%) GEL Place 5 g onto the skin daily.     No facility-administered medications prior to visit.     PAST MEDICAL HISTORY: Past Medical History:  Diagnosis Date    . Anemia   . Arthritis   . Chronic back pain    pain medication for 20 years  . Colon cancer ([place]) 1999  . Depression   . High triglycerides   . Hyperlipidemia   . Hypertension   . Hypothyroidism   . Neuromuscular disorder (HCC)    tremors in both arms and legs  . Sleep apnea    does not use CPAP  . Tremor     PAST SURGICAL HISTORY: Past Surgical History:  Procedure Laterality Date  . ANTERIOR LATERAL LUMBAR FUSION WITH PERCUTANEOUS SCREW 1 LEVEL Left 12/29/2013   Procedure: LUMBAR THREE TO LUMBAR FOUR ANTERIOR LATERAL LUMBAR FUSION WITH PERCUTANEOUS SCREW 1 LEVEL;  Surgeon: Charlie Pitter, MD;  Location: [facility];  Service: Neurosurgery;  Laterality: Left;  . APPENDECTOMY    . BACK SURGERY     x 2  . BALLOON DILATION  06/27/2016   Procedure: BALLOON DILATION;  Surgeon: Danie Binder, MD;  [facility];  Service: Endoscopy;;  . CATARACT EXTRACTION     bilateral  . COLON RESECTION  08/1998   s/p chemotherapy  . COLONOSCOPY N/A 05/09/2013   Procedure: COLONOSCOPY;  Surgeon: Danie Binder, MD;  [facility];  Service: Endoscopy;  Laterality: N/A;  10:45  . COLONOSCOPY WITH PROPOFOL N/A 06/27/2016   Dr. Oneida Alar: 2 simple adenomas. Surveillance in 5 years   . ESOPHAGOGASTRODUODENOSCOPY (EGD) WITH PROPOFOL N/A 06/27/2016   Dr. Oneida Alar: normal esophagus, gastritis, pyloric stenosis s/p dilation, duodenitis  . HERNIA REPAIR Bilateral     2  . KNEE SURGERY Left   . LUMBAR LAMINECTOMY/DECOMPRESSION MICRODISCECTOMY Bilateral 01/10/2013   Procedure: LUMBAR LAMINECTOMY/DECOMPRESSION MICRODISCECTOMY 2 LEVELS;  Surgeon: Charlie Pitter, MD;  Location: [place] [facility];  Service: Neurosurgery;  Laterality: Bilateral;  Bilateral lumbar three-four,Lumbar four-five  . POLYPECTOMY  06/27/2016   Procedure: POLYPECTOMY;  Surgeon: Danie Binder, MD;  [facility];  Service: Endoscopy;;  Ascending colon polyp, sigmoid colon polyp  . PORTACATH PLACEMENT     and removal   . SHOULDER ACROMIOPLASTY Right 11/07/2012   Procedure: SHOULDER ACROMIOPLASTY;  Surgeon: Sanjuana Kava, MD;  Location: [facility];  Service: Orthopedics;  Laterality: Right;  . SHOULDER OPEN ROTATOR CUFF REPAIR Right 11/07/2012   Procedure: ROTATOR CUFF REPAIR SHOULDER OPEN;  Surgeon: Sanjuana Kava, MD;  Location: [facility];  Service: Orthopedics;  Laterality: Right;  Right Open Rotator Cuff Repair  . VASECTOMY      FAMILY HISTORY: Family History  Problem Relation Age of Onset  . Heart disease Mother   . Cancer - Lung Father   . Cancer - Lung Sister   . Colon cancer Neg Hx     SOCIAL HISTORY:  Social History   Socioeconomic History  . Marital status: Married    Spouse name: Not on file  . Number of children: 2  . Years  of education: Not on file  . Highest education level: Not on file  Social Needs  . Financial resource strain: Not on file  . Food insecurity - worry: Not on file  . Food insecurity - inability: Not on file  . Transportation needs - medical: Not on file  . Transportation needs - non-medical: Not on file  Occupational History  . Occupation: out since back injury, [employer] delivery    Employer: RETIRED  Tobacco Use  . Smoking status: Never Smoker  . Smokeless tobacco: Never Used  Substance and Sexual Activity  . Alcohol use: No    Comment: patient quit in 1988. patient drinks 5 cups of coffee weekly  . Drug use: No  . Sexual activity: Yes    Birth control/protection: None  Other Topics Concern  . Not on file  Social History Narrative   lives with wife   No caffeine   High school education   retired     PHYSICAL EXAM  GENERAL EXAM/CONSTITUTIONAL: Vitals:  Vitals:   10/01/17 1244  BP: (!) 184/82  Pulse: (!) 56  Weight: 202 lb (91.6 kg)  Height: 5\' 7"  (1.702 m)   Body mass index is 31.64 kg/m. No exam data present  Patient is in no distress; well developed, nourished and groomed; neck is supple  CARDIOVASCULAR:  Examination of carotid arteries is  normal; no carotid bruits  Regular rate and rhythm, no murmurs  Examination of peripheral vascular system by observation and palpation is normal  EYES:  Ophthalmoscopic exam of optic discs and posterior segments is normal; no papilledema or hemorrhages  MUSCULOSKELETAL:  Gait, strength, tone, movements noted in Neurologic exam below  NEUROLOGIC: MENTAL STATUS:  MMSE - [place] Exam 08/20/2017  Orientation to time 3  Orientation to Place 4  Registration 3  Attention/ Calculation 3  Recall 2  Language- name 2 objects 2  Language- repeat 0  Language- follow 3 step command 3  Language- read & follow direction 1  Write a sentence 1  Copy design 0  Total score 22    awake, alert, oriented to person, place and time  recent and remote memory intact  normal attention and concentration  language fluent, comprehension intact, naming intact,   fund of knowledge appropriate  CRANIAL NERVE:   2nd - no papilledema on fundoscopic exam  2nd, 3rd, 4th, 6th - pupils equal and reactive to light, visual fields full to confrontation, extraocular muscles intact, no nystagmus  5th - facial sensation symmetric  7th - facial strength symmetric  8th - hearing intact  9th - palate elevates symmetrically, uvula midline  11th - shoulder shrug symmetric  12th - tongue protrusion midline  SUBTLE MOUTH TREMOR  MOTOR:   POSTURAL > ACTION TREMOR  SUBTLE RESTING TREMOR  MILD BRADYKINESIA   BUE 5  BLE 5 WITH SIGNIFICANT HIGH AMPLITUDE COARSE TREMOR WITH HIP FLEX, KNEE EXT AND KNEE FLEX  normal bulk and tone, full strength in the BUE, BLE  SENSORY:   normal and symmetric to light touch  ABSENT VIB AT TOES; REDUCED AT KNEES (RIGHT WORSE THAN LEFT)  COORDINATION:   finger-nose-finger, fine finger movements --> INTENTION TREMOR  REFLEXES:   deep tendon reflexes TRACE and symmetric  GAIT/STATION:   narrow based gait; ABLE TO WALK UNASSISTED    DIAGNOSTIC  DATA (LABS, IMAGING, TESTING) - I reviewed patient records, labs, notes, testing and imaging myself where available.  Lab Results  Component Value Date   WBC 4.2 07/25/2016  HGB 14.9 07/25/2016   HCT 44.5 07/25/2016   MCV 92.7 07/25/2016   PLT 216 07/25/2016      Component Value Date/Time   NA 136 06/22/2016 1315   NA 143 01/06/2013   K 4.3 06/22/2016 1315   K 5.0 01/06/2013   CL 102 06/22/2016 1315   CO2 30 06/22/2016 1315   GLUCOSE 129 (H) 06/22/2016 1315   BUN 18 06/22/2016 1315   BUN 10 01/06/2013   CREATININE 1.01 06/22/2016 1315   CREATININE 0.85 01/06/2013   CALCIUM 9.4 06/22/2016 1315   CALCIUM 10.3 01/06/2013   PROT 7.9 05/30/2014 1722   ALBUMIN 4.4 05/30/2014 1722   ALBUMIN 4.8 01/06/2013   AST 48 (H) 05/30/2014 1722   AST 23 01/06/2013   ALT 75 (H) 05/30/2014 1722   ALKPHOS 69 05/30/2014 1722   ALKPHOS 51.0 01/06/2013   BILITOT 0.4 05/30/2014 1722   BILITOT 0.6 01/06/2013   GFRNONAA >60 06/22/2016 1315   GFRAA >60 06/22/2016 1315   No results found for: CHOL, HDL, LDLCALC, LDLDIRECT, TRIG, CHOLHDL No results found for: HGBA1C No results found for: VITAMINB12 No results found for: TSH   10/22/13 CT myelogram 1. Prior L3-4 and L4-5 posterior decompression without evidence of spinal stenosis. 2. Multilevel neural foraminal stenosis from L3-4 to L5-S1, greatest on the right at L5-S1. 3. Bilateral L3 pars defects.   05/05/13 MRI lumbar  1.  Postoperative change L3-4 and L4-5 as described.  The thecal sac appears patent at each level with epidural enhancement about the operative site and exiting nerve roots identified.  Disc bulging results in some residual foraminal narrowing most notable at L4-5 but no nerve root compression is identified. 2.  Disc bulge and facet arthropathy cause moderate to moderately severe foraminal narrowing at L5-S1, worse on the right.  11/05/14 MRI lumbar [I reviewed images myself and agree with interpretation. -VRP] 1.  Sequelae of decompression and fusion at L3-L4, and decompression at L4-L5, with improved thecal sac patency at both levels since 2014. Residual foraminal stenosis at both levels and left lateral recess stenosis at L4-L5. 2. Stable lumbar spine elsewhere, including multifactorial mild spinal stenosis at L1-L2 and multifactorial mild to moderate right lateral recess and biforaminal stenosis at L5-S1.  09/07/17 Unremarkable MRI scan of the brain showing age-appropriate changes of chronic microvascular ischemia and generalized cerebral atrophy. [I reviewed images myself and agree with interpretation. -VRP]   09/07/17 MRI scan of cervical spine [I reviewed images myself and agree with interpretation. -VRP]  - prominent spondylitic changes at C4-5 and C5-6 where there is prominent disc osteophyte protrusions along with ligamentum flavum and facet hypertrophy resulting in moderate canal and foraminal narrowing more prominent on the left.    ASSESSMENT AND PLAN  77 y.o. year old male here with gradual onset progressive short-term memory loss, confusion, visual hallucinations, subtle resting tremor, significant postural and intention tremor, gait and balance difficulty, suspicious for dementia with Lewy bodies.  Also with history of lumbar spinal stenosis status post surgery x2 in 2014 in 2015, with resultant lower extremity numbness, weakness, gait difficulty.  Also with underlying anxiety disorder.   Dx: dementia with lewy bodies + lumbar spinal stenosis + cervical spinal stenosis + anxiety  1. Memory loss   2. Cervicalgia   3. Cervical spinal stenosis   4. Osteoarthritis of spine with radiculopathy, lumbar region      PLAN:  GAIT DIFFICULTY - consider follow up with neurosurgery (Dr. Annette Stable) re: cervical spinal stenosis and lumbar spinal  stenosis; patient wants to hold off for now - consider home health physical therapy evaluation (with social work and nursing); patient wants to hold off for  now - use rollator walker; may need wheelchair  MEMORY LOSS (dementia with lewy bodies, anxiety, pain) - brain healthy activities reviewed - increase safety and supervision; no driving - discussed donepezil or memantine, but given limited benefit, patient and family would like to hold off  Return if symptoms worsen or fail to improve, for return to PCP. may follow up as needed    Penni Bombard, MD 4/58/0998, 33:82 PM Certified in Neurology, Neurophysiology and Neuroimaging  [facility] [address] [place], [place] [address] [phone]

## 2017-10-04 ENCOUNTER — Ambulatory Visit: Payer: Medicare HMO

## 2017-10-05 ENCOUNTER — Ambulatory Visit: Payer: Medicare HMO | Admitting: Physical Therapy

## 2017-10-11 ENCOUNTER — Ambulatory Visit: Payer: Medicare HMO

## 2017-11-07 ENCOUNTER — Ambulatory Visit: Payer: Medicare HMO | Admitting: Gastroenterology

## 2017-11-07 ENCOUNTER — Encounter: Payer: Self-pay | Admitting: Gastroenterology

## 2017-11-07 VITALS — BP 112/68 | HR 61 | Temp 97.7°F | Ht 68.0 in | Wt 202.2 lb

## 2017-11-07 DIAGNOSIS — K219 Gastro-esophageal reflux disease without esophagitis: Secondary | ICD-10-CM | POA: Diagnosis not present

## 2017-11-07 DIAGNOSIS — K5900 Constipation, unspecified: Secondary | ICD-10-CM

## 2017-11-07 MED ORDER — LINACLOTIDE 145 MCG PO CAPS: 145.0000 ug | ORAL_CAPSULE | Freq: Every day | ORAL | 3 refills | Status: AC

## 2017-11-07 MED ORDER — PANTOPRAZOLE SODIUM 40 MG PO TBEC: 40.0000 mg | DELAYED_RELEASE_TABLET | Freq: Every day | ORAL | 3 refills | Status: DC

## 2017-11-07 NOTE — Progress Notes (Signed)
Referring Provider: Celene Squibb, MD Primary Care Physician:  Celene Squibb, MD Primary GI: Dr. Oneida Alar   Chief Complaint  Patient presents with  . h/o colon cancer    f/u. needs refill on linzess/prilosec  . Constipation    HPI:   John Molina is a 77 y.o. male presenting today with a history of Stage III colon cancer in 1999, s/p resection and chemo. Colonoscopy recently in 2017 with 2 simple adenomas and due for surveillance in 2022. EGD at time of colonoscopy noted pyloric stenosis s/p dilation, gastritis, duodenitis.   He has been on Prilosec and Linzess. Linzess 145 mcg one dose a month.   Notes vague mid abdominal pain for the past several days. States "not real bad". Not able to describe it. Intermittent. His daughter is present with him. He was recently diagnosed with Lew body dementia.    Past Medical History:  Diagnosis Date  . Anemia   . Arthritis   . Chronic back pain    pain medication for 20 years  . Colon cancer ([place]) 1999  . Depression   . High triglycerides   . Hyperlipidemia   . Hypertension   . Hypothyroidism   . Neuromuscular disorder (HCC)    tremors in both arms and legs  . Sleep apnea    does not use CPAP  . Tremor     Past Surgical History:  Procedure Laterality Date  . ANTERIOR LATERAL LUMBAR FUSION WITH PERCUTANEOUS SCREW 1 LEVEL Left 12/29/2013   Procedure: LUMBAR THREE TO LUMBAR FOUR ANTERIOR LATERAL LUMBAR FUSION WITH PERCUTANEOUS SCREW 1 LEVEL;  Surgeon: Charlie Pitter, MD;  Location: [place] [facility];  Service: Neurosurgery;  Laterality: Left;  . APPENDECTOMY    . BACK SURGERY     x 2  . BALLOON DILATION  06/27/2016   Procedure: BALLOON DILATION;  Surgeon: Danie Binder, MD;  [facility];  Service: Endoscopy;;  . CATARACT EXTRACTION     bilateral  . COLON RESECTION  08/1998   s/p chemotherapy  . COLONOSCOPY N/A 05/09/2013   Procedure: COLONOSCOPY;  Surgeon: Danie Binder, MD;  [facility];  Service:  Endoscopy;  Laterality: N/A;  10:45  . COLONOSCOPY WITH PROPOFOL N/A 06/27/2016   Dr. Oneida Alar: 2 simple adenomas. Surveillance in 5 years   . ESOPHAGOGASTRODUODENOSCOPY (EGD) WITH PROPOFOL N/A 06/27/2016   Dr. Oneida Alar: normal esophagus, gastritis, pyloric stenosis s/p dilation, duodenitis  . HERNIA REPAIR Bilateral     2  . KNEE SURGERY Left   . LUMBAR LAMINECTOMY/DECOMPRESSION MICRODISCECTOMY Bilateral 01/10/2013   Procedure: LUMBAR LAMINECTOMY/DECOMPRESSION MICRODISCECTOMY 2 LEVELS;  Surgeon: Charlie Pitter, MD;  Location: [facility];  Service: Neurosurgery;  Laterality: Bilateral;  Bilateral lumbar three-four,Lumbar four-five  . POLYPECTOMY  06/27/2016   Procedure: POLYPECTOMY;  Surgeon: Danie Binder, MD;  [facility];  Service: Endoscopy;;  Ascending colon polyp, sigmoid colon polyp  . PORTACATH PLACEMENT     and removal  . SHOULDER ACROMIOPLASTY Right 11/07/2012   Procedure: SHOULDER ACROMIOPLASTY;  Surgeon: Sanjuana Kava, MD;  Location: [facility];  Service: Orthopedics;  Laterality: Right;  . SHOULDER OPEN ROTATOR CUFF REPAIR Right 11/07/2012   Procedure: ROTATOR CUFF REPAIR SHOULDER OPEN;  Surgeon: Sanjuana Kava, MD;  Location: [facility];  Service: Orthopedics;  Laterality: Right;  Right Open Rotator Cuff Repair  . VASECTOMY      Current Outpatient Medications  Medication Sig Dispense Refill  . amphetamine-dextroamphetamine (ADDERALL) 30  MG tablet Take 30 mg by mouth daily with breakfast.     . buPROPion (WELLBUTRIN XL) 300 MG 24 hr tablet 300 mg daily.    . citalopram (CELEXA) 40 MG tablet Take 40 mg by mouth daily.    . cloNIDine (CATAPRES) 0.2 MG tablet 0.2 mg 2 (two) times daily.     Marland Kitchen HYDROmorphone (DILAUDID) 8 MG tablet Take 8 mg by mouth 3 (three) times daily.     Marland Kitchen levothyroxine (SYNTHROID, LEVOTHROID) 50 MCG tablet 50 mcg daily.    Marland Kitchen linaclotide (LINZESS) 145 MCG CAPS capsule Take 1 capsule (145 mcg total) by mouth daily before breakfast. 30 capsule 5  . losartan  (COZAAR) 100 MG tablet Take 100 mg by mouth daily.    . memantine (NAMENDA) 10 MG tablet 5 mg 2 (two) times daily.    . mupirocin ointment (BACTROBAN) 2 %     . omeprazole (PRILOSEC) 20 MG capsule TAKE 1 CAPSULE BY MOUTH 30 MINUTES BEFORE BREAKFAST 90 capsule 3  . oxymorphone (OPANA ER) 30 MG 12 hr tablet Take 30 mg by mouth daily.     . propranolol (INDERAL) 40 MG tablet Take 40 mg by mouth 2 (two) times daily.    . rosuvastatin (CRESTOR) 5 MG tablet Take 5 mg by mouth daily.    . sodium-potassium bicarbonate (ALKA-SELTZER GOLD) TBEF dissolvable tablet Take 1 tablet by mouth daily as needed.    . testosterone (ANDROGEL) 50 MG/5GM (1%) GEL Place 5 g onto the skin daily.     No current facility-administered medications for this visit.     Allergies as of 11/07/2017 - Review Complete 11/07/2017  Allergen Reaction Noted  . Morphine and related  12/24/2013    Family History  Problem Relation Age of Onset  . Heart disease Mother   . Cancer - Lung Father   . Cancer - Lung Sister   . Colon cancer Neg Hx     Social History   Socioeconomic History  . Marital status: Married    Spouse name: None  . Number of children: 2  . Years of education: None  . Highest education level: None  Social Needs  . Financial resource strain: None  . Food insecurity - worry: None  . Food insecurity - inability: None  . Transportation needs - medical: None  . Transportation needs - non-medical: None  Occupational History  . Occupation: out since back injury, [employer] delivery    Employer: RETIRED  Tobacco Use  . Smoking status: Never Smoker  . Smokeless tobacco: Never Used  Substance and Sexual Activity  . Alcohol use: No    Comment: patient quit in 1988. patient drinks 5 cups of coffee weekly  . Drug use: No  . Sexual activity: Yes    Birth control/protection: None  Other Topics Concern  . None  Social History Narrative   lives with wife   No caffeine   High school education   retired     Review of Systems: Gen: Denies fever, chills, anorexia. Denies fatigue, weakness, weight loss.  CV: Denies chest pain, palpitations, syncope, peripheral edema, and claudication. Resp: Denies dyspnea at rest, cough, wheezing, coughing up blood, and pleurisy. GI: see HPI  Derm: Denies rash, itching, dry skin Psych: Denies depression, anxiety, memory loss, confusion. No homicidal or suicidal ideation.  Heme: Denies bruising, bleeding, and enlarged lymph nodes.  Physical Exam: BP 112/68   Pulse 61   Temp 97.7 F (36.5 C) (Oral)   Ht 5\' 8"  (  1.727 m)   Wt 202 lb 3.2 oz (91.7 kg)   BMI 30.74 kg/m  General:   Alert and oriented. No distress noted. Pleasant and cooperative.  Head:  Normocephalic and atraumatic. Eyes:  Conjuctiva clear without scleral icterus. Mouth:  Oral mucosa pink and moist.  Abdomen:  +BS, soft, non-tender and non-distended. No rebound or guarding. No HSM or masses noted. Msk:  Symmetrical without gross deformities. Normal posture. Extremities:  Without edema. Neurologic:  Alert and  oriented x4 Psych:  Alert and cooperative. Normal mood and affect.

## 2017-11-07 NOTE — Patient Instructions (Signed)
I have refilled Linzess for you.  I would like for you to stop Prilosec. This can interact with Celexa. I have sent in Protonix to take instead, 30 minutes before breakfast daily.  Please complete the stool sample and return as soon as you can.  We will see you in 1 year!  It was a pleasure to see you today. I strive to create trusting relationships with patients to provide genuine, compassionate, and quality care. I value your feedback. If you receive a survey regarding your visit,  I greatly appreciate you the taking time to fill this out.   Annitta Needs, PhD, ANP-BC [facility]

## 2017-11-11 NOTE — Assessment & Plan Note (Signed)
Doing well with Linzess only prn. Refills provided. Check ifobt. Next colonoscopy in 2022 due to history of colon cancer unless clinical changes in interim. Return in 1 year.

## 2017-11-11 NOTE — Assessment & Plan Note (Signed)
Has previously been on Prilosec, but I am changing this to Protonix due to interaction with Celexa. Return in 1 year or sooner if needed.

## 2017-11-12 NOTE — Progress Notes (Signed)
CC'D TO PCP °

## 2017-11-20 DIAGNOSIS — Z6831 Body mass index (BMI) 31.0-31.9, adult: Secondary | ICD-10-CM | POA: Diagnosis not present

## 2017-11-20 DIAGNOSIS — J069 Acute upper respiratory infection, unspecified: Secondary | ICD-10-CM | POA: Diagnosis not present

## 2017-11-25 ENCOUNTER — Other Ambulatory Visit (HOSPITAL_COMMUNITY): Payer: Self-pay | Admitting: Internal Medicine

## 2017-11-25 ENCOUNTER — Ambulatory Visit (HOSPITAL_COMMUNITY)
Admission: RE | Admit: 2017-11-25 | Discharge: 2017-11-25 | Disposition: A | Discharge status: Home / Self Care | Payer: Medicare HMO | Source: Ambulatory Visit | Attending: Internal Medicine | Admitting: Internal Medicine

## 2017-11-25 DIAGNOSIS — R059 Cough, unspecified: Secondary | ICD-10-CM

## 2017-11-25 DIAGNOSIS — R042 Hemoptysis: Secondary | ICD-10-CM | POA: Diagnosis not present

## 2017-11-25 DIAGNOSIS — R05 Cough: Secondary | ICD-10-CM

## 2017-11-28 DIAGNOSIS — R69 Illness, unspecified: Secondary | ICD-10-CM | POA: Diagnosis not present

## 2017-11-28 DIAGNOSIS — I10 Essential (primary) hypertension: Secondary | ICD-10-CM | POA: Diagnosis not present

## 2017-11-28 DIAGNOSIS — E119 Type 2 diabetes mellitus without complications: Secondary | ICD-10-CM | POA: Diagnosis not present

## 2017-11-28 DIAGNOSIS — Z6831 Body mass index (BMI) 31.0-31.9, adult: Secondary | ICD-10-CM | POA: Diagnosis not present

## 2017-11-28 DIAGNOSIS — N529 Male erectile dysfunction, unspecified: Secondary | ICD-10-CM | POA: Diagnosis not present

## 2017-11-28 DIAGNOSIS — R5382 Chronic fatigue, unspecified: Secondary | ICD-10-CM | POA: Diagnosis not present

## 2017-11-28 DIAGNOSIS — J060 Acute laryngopharyngitis: Secondary | ICD-10-CM | POA: Diagnosis not present

## 2017-11-28 DIAGNOSIS — G894 Chronic pain syndrome: Secondary | ICD-10-CM | POA: Diagnosis not present

## 2017-11-28 DIAGNOSIS — E782 Mixed hyperlipidemia: Secondary | ICD-10-CM | POA: Diagnosis not present

## 2017-11-30 DIAGNOSIS — J208 Acute bronchitis due to other specified organisms: Secondary | ICD-10-CM | POA: Diagnosis not present

## 2017-11-30 DIAGNOSIS — R0602 Shortness of breath: Secondary | ICD-10-CM | POA: Diagnosis not present

## 2017-12-04 DIAGNOSIS — M47817 Spondylosis without myelopathy or radiculopathy, lumbosacral region: Secondary | ICD-10-CM | POA: Diagnosis not present

## 2017-12-04 DIAGNOSIS — Z6830 Body mass index (BMI) 30.0-30.9, adult: Secondary | ICD-10-CM | POA: Diagnosis not present

## 2017-12-04 DIAGNOSIS — I10 Essential (primary) hypertension: Secondary | ICD-10-CM | POA: Diagnosis not present

## 2017-12-04 DIAGNOSIS — E119 Type 2 diabetes mellitus without complications: Secondary | ICD-10-CM | POA: Diagnosis not present

## 2017-12-04 DIAGNOSIS — N529 Male erectile dysfunction, unspecified: Secondary | ICD-10-CM | POA: Diagnosis not present

## 2017-12-04 DIAGNOSIS — Z125 Encounter for screening for malignant neoplasm of prostate: Secondary | ICD-10-CM | POA: Diagnosis not present

## 2017-12-04 DIAGNOSIS — E782 Mixed hyperlipidemia: Secondary | ICD-10-CM | POA: Diagnosis not present

## 2017-12-04 DIAGNOSIS — M961 Postlaminectomy syndrome, not elsewhere classified: Secondary | ICD-10-CM | POA: Diagnosis not present

## 2017-12-04 DIAGNOSIS — E291 Testicular hypofunction: Secondary | ICD-10-CM | POA: Diagnosis not present

## 2017-12-06 DIAGNOSIS — R69 Illness, unspecified: Secondary | ICD-10-CM | POA: Diagnosis not present

## 2017-12-06 DIAGNOSIS — N529 Male erectile dysfunction, unspecified: Secondary | ICD-10-CM | POA: Diagnosis not present

## 2017-12-06 DIAGNOSIS — G894 Chronic pain syndrome: Secondary | ICD-10-CM | POA: Diagnosis not present

## 2017-12-06 DIAGNOSIS — G3184 Mild cognitive impairment, so stated: Secondary | ICD-10-CM | POA: Diagnosis not present

## 2017-12-06 DIAGNOSIS — R251 Tremor, unspecified: Secondary | ICD-10-CM | POA: Diagnosis not present

## 2017-12-06 DIAGNOSIS — E782 Mixed hyperlipidemia: Secondary | ICD-10-CM | POA: Diagnosis not present

## 2017-12-06 DIAGNOSIS — R5382 Chronic fatigue, unspecified: Secondary | ICD-10-CM | POA: Diagnosis not present

## 2017-12-06 DIAGNOSIS — E119 Type 2 diabetes mellitus without complications: Secondary | ICD-10-CM | POA: Diagnosis not present

## 2017-12-07 DIAGNOSIS — E291 Testicular hypofunction: Secondary | ICD-10-CM | POA: Diagnosis not present

## 2017-12-07 DIAGNOSIS — R944 Abnormal results of kidney function studies: Secondary | ICD-10-CM | POA: Diagnosis not present

## 2017-12-07 DIAGNOSIS — E782 Mixed hyperlipidemia: Secondary | ICD-10-CM | POA: Diagnosis not present

## 2017-12-07 DIAGNOSIS — I10 Essential (primary) hypertension: Secondary | ICD-10-CM | POA: Diagnosis not present

## 2017-12-07 DIAGNOSIS — G3184 Mild cognitive impairment, so stated: Secondary | ICD-10-CM | POA: Diagnosis not present

## 2017-12-07 DIAGNOSIS — E119 Type 2 diabetes mellitus without complications: Secondary | ICD-10-CM | POA: Diagnosis not present

## 2017-12-07 DIAGNOSIS — R69 Illness, unspecified: Secondary | ICD-10-CM | POA: Diagnosis not present

## 2017-12-07 DIAGNOSIS — G894 Chronic pain syndrome: Secondary | ICD-10-CM | POA: Diagnosis not present

## 2017-12-07 DIAGNOSIS — R5382 Chronic fatigue, unspecified: Secondary | ICD-10-CM | POA: Diagnosis not present

## 2017-12-07 DIAGNOSIS — R251 Tremor, unspecified: Secondary | ICD-10-CM | POA: Diagnosis not present

## 2018-01-21 ENCOUNTER — Ambulatory Visit: Payer: Medicare HMO | Admitting: Diagnostic Neuroimaging

## 2018-01-30 DIAGNOSIS — R251 Tremor, unspecified: Secondary | ICD-10-CM | POA: Diagnosis not present

## 2018-01-30 DIAGNOSIS — M48061 Spinal stenosis, lumbar region without neurogenic claudication: Secondary | ICD-10-CM | POA: Diagnosis not present

## 2018-01-30 DIAGNOSIS — G894 Chronic pain syndrome: Secondary | ICD-10-CM | POA: Diagnosis not present

## 2018-01-30 DIAGNOSIS — E039 Hypothyroidism, unspecified: Secondary | ICD-10-CM | POA: Diagnosis not present

## 2018-01-30 DIAGNOSIS — G3183 Dementia with Lewy bodies: Secondary | ICD-10-CM | POA: Diagnosis not present

## 2018-01-30 DIAGNOSIS — E785 Hyperlipidemia, unspecified: Secondary | ICD-10-CM | POA: Diagnosis not present

## 2018-01-30 DIAGNOSIS — R5382 Chronic fatigue, unspecified: Secondary | ICD-10-CM | POA: Diagnosis not present

## 2018-01-30 DIAGNOSIS — I10 Essential (primary) hypertension: Secondary | ICD-10-CM | POA: Diagnosis not present

## 2018-01-30 DIAGNOSIS — R532 Functional quadriplegia: Secondary | ICD-10-CM | POA: Diagnosis not present

## 2018-01-30 DIAGNOSIS — K210 Gastro-esophageal reflux disease with esophagitis: Secondary | ICD-10-CM | POA: Diagnosis not present

## 2018-02-21 DIAGNOSIS — R443 Hallucinations, unspecified: Secondary | ICD-10-CM | POA: Diagnosis not present

## 2018-02-21 DIAGNOSIS — R2689 Other abnormalities of gait and mobility: Secondary | ICD-10-CM | POA: Diagnosis not present

## 2018-02-21 DIAGNOSIS — I10 Essential (primary) hypertension: Secondary | ICD-10-CM | POA: Diagnosis not present

## 2018-02-21 DIAGNOSIS — R413 Other amnesia: Secondary | ICD-10-CM | POA: Diagnosis not present

## 2018-02-25 DIAGNOSIS — G259 Extrapyramidal and movement disorder, unspecified: Secondary | ICD-10-CM | POA: Diagnosis not present

## 2018-02-25 DIAGNOSIS — M545 Low back pain: Secondary | ICD-10-CM | POA: Diagnosis not present

## 2018-02-25 DIAGNOSIS — Z79899 Other long term (current) drug therapy: Secondary | ICD-10-CM | POA: Diagnosis not present

## 2018-02-25 DIAGNOSIS — M5126 Other intervertebral disc displacement, lumbar region: Secondary | ICD-10-CM | POA: Diagnosis not present

## 2018-02-25 DIAGNOSIS — M5417 Radiculopathy, lumbosacral region: Secondary | ICD-10-CM | POA: Diagnosis not present

## 2018-02-25 DIAGNOSIS — G3183 Dementia with Lewy bodies: Secondary | ICD-10-CM | POA: Diagnosis not present

## 2018-02-25 DIAGNOSIS — M5137 Other intervertebral disc degeneration, lumbosacral region: Secondary | ICD-10-CM | POA: Diagnosis not present

## 2018-03-07 DIAGNOSIS — R413 Other amnesia: Secondary | ICD-10-CM | POA: Diagnosis not present

## 2018-03-07 DIAGNOSIS — R443 Hallucinations, unspecified: Secondary | ICD-10-CM | POA: Diagnosis not present

## 2018-03-25 DIAGNOSIS — R443 Hallucinations, unspecified: Secondary | ICD-10-CM | POA: Diagnosis not present

## 2018-03-25 DIAGNOSIS — M5137 Other intervertebral disc degeneration, lumbosacral region: Secondary | ICD-10-CM | POA: Diagnosis not present

## 2018-03-25 DIAGNOSIS — M5417 Radiculopathy, lumbosacral region: Secondary | ICD-10-CM | POA: Diagnosis not present

## 2018-03-25 DIAGNOSIS — R2689 Other abnormalities of gait and mobility: Secondary | ICD-10-CM | POA: Diagnosis not present

## 2018-03-25 DIAGNOSIS — M5126 Other intervertebral disc displacement, lumbar region: Secondary | ICD-10-CM | POA: Diagnosis not present

## 2018-03-25 DIAGNOSIS — M545 Low back pain: Secondary | ICD-10-CM | POA: Diagnosis not present

## 2018-03-25 DIAGNOSIS — G3183 Dementia with Lewy bodies: Secondary | ICD-10-CM | POA: Diagnosis not present

## 2018-03-25 DIAGNOSIS — R413 Other amnesia: Secondary | ICD-10-CM | POA: Diagnosis not present

## 2018-03-25 DIAGNOSIS — G259 Extrapyramidal and movement disorder, unspecified: Secondary | ICD-10-CM | POA: Diagnosis not present

## 2018-04-01 DIAGNOSIS — G259 Extrapyramidal and movement disorder, unspecified: Secondary | ICD-10-CM | POA: Diagnosis not present

## 2018-04-01 DIAGNOSIS — Z79899 Other long term (current) drug therapy: Secondary | ICD-10-CM | POA: Diagnosis not present

## 2018-04-01 DIAGNOSIS — M5126 Other intervertebral disc displacement, lumbar region: Secondary | ICD-10-CM | POA: Diagnosis not present

## 2018-04-01 DIAGNOSIS — M5417 Radiculopathy, lumbosacral region: Secondary | ICD-10-CM | POA: Diagnosis not present

## 2018-04-01 DIAGNOSIS — G3183 Dementia with Lewy bodies: Secondary | ICD-10-CM | POA: Diagnosis not present

## 2018-04-01 DIAGNOSIS — M545 Low back pain: Secondary | ICD-10-CM | POA: Diagnosis not present

## 2018-04-01 DIAGNOSIS — M5137 Other intervertebral disc degeneration, lumbosacral region: Secondary | ICD-10-CM | POA: Diagnosis not present

## 2018-04-05 DIAGNOSIS — I10 Essential (primary) hypertension: Secondary | ICD-10-CM | POA: Diagnosis not present

## 2018-04-05 DIAGNOSIS — R238 Other skin changes: Secondary | ICD-10-CM | POA: Diagnosis not present

## 2018-04-06 DIAGNOSIS — M47817 Spondylosis without myelopathy or radiculopathy, lumbosacral region: Secondary | ICD-10-CM | POA: Diagnosis not present

## 2018-04-06 DIAGNOSIS — M5136 Other intervertebral disc degeneration, lumbar region: Secondary | ICD-10-CM | POA: Diagnosis not present

## 2018-04-06 DIAGNOSIS — M48061 Spinal stenosis, lumbar region without neurogenic claudication: Secondary | ICD-10-CM | POA: Diagnosis not present

## 2018-04-08 DIAGNOSIS — M48061 Spinal stenosis, lumbar region without neurogenic claudication: Secondary | ICD-10-CM | POA: Diagnosis not present

## 2018-04-08 DIAGNOSIS — E785 Hyperlipidemia, unspecified: Secondary | ICD-10-CM | POA: Diagnosis not present

## 2018-04-08 DIAGNOSIS — R532 Functional quadriplegia: Secondary | ICD-10-CM | POA: Diagnosis not present

## 2018-04-08 DIAGNOSIS — I10 Essential (primary) hypertension: Secondary | ICD-10-CM | POA: Diagnosis not present

## 2018-04-08 DIAGNOSIS — G3183 Dementia with Lewy bodies: Secondary | ICD-10-CM | POA: Diagnosis not present

## 2018-04-08 DIAGNOSIS — G894 Chronic pain syndrome: Secondary | ICD-10-CM | POA: Diagnosis not present

## 2018-04-08 DIAGNOSIS — K210 Gastro-esophageal reflux disease with esophagitis: Secondary | ICD-10-CM | POA: Diagnosis not present

## 2018-04-08 DIAGNOSIS — E039 Hypothyroidism, unspecified: Secondary | ICD-10-CM | POA: Diagnosis not present

## 2018-04-08 DIAGNOSIS — R5382 Chronic fatigue, unspecified: Secondary | ICD-10-CM | POA: Diagnosis not present

## 2018-04-08 DIAGNOSIS — G20 Parkinson's disease: Secondary | ICD-10-CM | POA: Diagnosis not present

## 2018-04-15 DIAGNOSIS — M5137 Other intervertebral disc degeneration, lumbosacral region: Secondary | ICD-10-CM | POA: Diagnosis not present

## 2018-04-15 DIAGNOSIS — M5417 Radiculopathy, lumbosacral region: Secondary | ICD-10-CM | POA: Diagnosis not present

## 2018-04-15 DIAGNOSIS — M545 Low back pain: Secondary | ICD-10-CM | POA: Diagnosis not present

## 2018-04-15 DIAGNOSIS — M5126 Other intervertebral disc displacement, lumbar region: Secondary | ICD-10-CM | POA: Diagnosis not present

## 2018-04-15 DIAGNOSIS — G3183 Dementia with Lewy bodies: Secondary | ICD-10-CM | POA: Diagnosis not present

## 2018-04-15 DIAGNOSIS — G259 Extrapyramidal and movement disorder, unspecified: Secondary | ICD-10-CM | POA: Diagnosis not present

## 2018-04-17 DIAGNOSIS — M5417 Radiculopathy, lumbosacral region: Secondary | ICD-10-CM | POA: Diagnosis not present

## 2018-04-17 DIAGNOSIS — M5137 Other intervertebral disc degeneration, lumbosacral region: Secondary | ICD-10-CM | POA: Diagnosis not present

## 2018-04-17 DIAGNOSIS — M5126 Other intervertebral disc displacement, lumbar region: Secondary | ICD-10-CM | POA: Diagnosis not present

## 2018-04-29 DIAGNOSIS — M5126 Other intervertebral disc displacement, lumbar region: Secondary | ICD-10-CM | POA: Diagnosis not present

## 2018-04-29 DIAGNOSIS — G3183 Dementia with Lewy bodies: Secondary | ICD-10-CM | POA: Diagnosis not present

## 2018-04-29 DIAGNOSIS — M5417 Radiculopathy, lumbosacral region: Secondary | ICD-10-CM | POA: Diagnosis not present

## 2018-04-29 DIAGNOSIS — M545 Low back pain: Secondary | ICD-10-CM | POA: Diagnosis not present

## 2018-04-29 DIAGNOSIS — G259 Extrapyramidal and movement disorder, unspecified: Secondary | ICD-10-CM | POA: Diagnosis not present

## 2018-04-29 DIAGNOSIS — M5137 Other intervertebral disc degeneration, lumbosacral region: Secondary | ICD-10-CM | POA: Diagnosis not present

## 2018-05-09 DIAGNOSIS — S39012D Strain of muscle, fascia and tendon of lower back, subsequent encounter: Secondary | ICD-10-CM | POA: Diagnosis not present

## 2018-05-09 DIAGNOSIS — M545 Low back pain: Secondary | ICD-10-CM | POA: Diagnosis not present

## 2018-05-09 DIAGNOSIS — M47817 Spondylosis without myelopathy or radiculopathy, lumbosacral region: Secondary | ICD-10-CM | POA: Diagnosis not present

## 2018-05-14 DIAGNOSIS — W06XXXA Fall from bed, initial encounter: Secondary | ICD-10-CM | POA: Diagnosis not present

## 2018-05-14 DIAGNOSIS — S0083XA Contusion of other part of head, initial encounter: Secondary | ICD-10-CM | POA: Diagnosis not present

## 2018-05-14 DIAGNOSIS — W01198A Fall on same level from slipping, tripping and stumbling with subsequent striking against other object, initial encounter: Secondary | ICD-10-CM | POA: Diagnosis not present

## 2018-05-14 DIAGNOSIS — S01112A Laceration without foreign body of left eyelid and periocular area, initial encounter: Secondary | ICD-10-CM | POA: Diagnosis not present

## 2018-05-14 DIAGNOSIS — S0181XA Laceration without foreign body of other part of head, initial encounter: Secondary | ICD-10-CM | POA: Diagnosis not present

## 2018-05-14 DIAGNOSIS — R69 Illness, unspecified: Secondary | ICD-10-CM | POA: Diagnosis not present

## 2018-05-14 DIAGNOSIS — Z7689 Persons encountering health services in other specified circumstances: Secondary | ICD-10-CM | POA: Diagnosis not present

## 2018-05-14 DIAGNOSIS — G20 Parkinson's disease: Secondary | ICD-10-CM | POA: Diagnosis not present

## 2018-05-15 DIAGNOSIS — M5137 Other intervertebral disc degeneration, lumbosacral region: Secondary | ICD-10-CM | POA: Diagnosis not present

## 2018-05-15 DIAGNOSIS — G3183 Dementia with Lewy bodies: Secondary | ICD-10-CM | POA: Diagnosis not present

## 2018-05-15 DIAGNOSIS — R296 Repeated falls: Secondary | ICD-10-CM | POA: Diagnosis not present

## 2018-05-15 DIAGNOSIS — M545 Low back pain: Secondary | ICD-10-CM | POA: Diagnosis not present

## 2018-05-15 DIAGNOSIS — M5126 Other intervertebral disc displacement, lumbar region: Secondary | ICD-10-CM | POA: Diagnosis not present

## 2018-05-15 DIAGNOSIS — M5417 Radiculopathy, lumbosacral region: Secondary | ICD-10-CM | POA: Diagnosis not present

## 2018-05-15 DIAGNOSIS — G259 Extrapyramidal and movement disorder, unspecified: Secondary | ICD-10-CM | POA: Diagnosis not present

## 2018-05-31 DIAGNOSIS — S01112D Laceration without foreign body of left eyelid and periocular area, subsequent encounter: Secondary | ICD-10-CM | POA: Diagnosis not present

## 2018-06-03 DIAGNOSIS — M545 Low back pain: Secondary | ICD-10-CM | POA: Diagnosis not present

## 2018-06-03 DIAGNOSIS — G259 Extrapyramidal and movement disorder, unspecified: Secondary | ICD-10-CM | POA: Diagnosis not present

## 2018-06-03 DIAGNOSIS — M5126 Other intervertebral disc displacement, lumbar region: Secondary | ICD-10-CM | POA: Diagnosis not present

## 2018-06-03 DIAGNOSIS — M5137 Other intervertebral disc degeneration, lumbosacral region: Secondary | ICD-10-CM | POA: Diagnosis not present

## 2018-06-03 DIAGNOSIS — R69 Illness, unspecified: Secondary | ICD-10-CM | POA: Diagnosis not present

## 2018-06-03 DIAGNOSIS — Z79899 Other long term (current) drug therapy: Secondary | ICD-10-CM | POA: Diagnosis not present

## 2018-06-03 DIAGNOSIS — M5417 Radiculopathy, lumbosacral region: Secondary | ICD-10-CM | POA: Diagnosis not present

## 2018-06-03 DIAGNOSIS — R296 Repeated falls: Secondary | ICD-10-CM | POA: Diagnosis not present

## 2018-06-03 DIAGNOSIS — G3183 Dementia with Lewy bodies: Secondary | ICD-10-CM | POA: Diagnosis not present

## 2018-06-17 DIAGNOSIS — G3183 Dementia with Lewy bodies: Secondary | ICD-10-CM | POA: Diagnosis not present

## 2018-06-17 DIAGNOSIS — R296 Repeated falls: Secondary | ICD-10-CM | POA: Diagnosis not present

## 2018-06-17 DIAGNOSIS — G259 Extrapyramidal and movement disorder, unspecified: Secondary | ICD-10-CM | POA: Diagnosis not present

## 2018-06-17 DIAGNOSIS — M5417 Radiculopathy, lumbosacral region: Secondary | ICD-10-CM | POA: Diagnosis not present

## 2018-06-17 DIAGNOSIS — M5137 Other intervertebral disc degeneration, lumbosacral region: Secondary | ICD-10-CM | POA: Diagnosis not present

## 2018-06-17 DIAGNOSIS — M545 Low back pain: Secondary | ICD-10-CM | POA: Diagnosis not present

## 2018-06-17 DIAGNOSIS — M5126 Other intervertebral disc displacement, lumbar region: Secondary | ICD-10-CM | POA: Diagnosis not present

## 2018-07-02 DIAGNOSIS — R2689 Other abnormalities of gait and mobility: Secondary | ICD-10-CM | POA: Diagnosis not present

## 2018-07-02 DIAGNOSIS — R443 Hallucinations, unspecified: Secondary | ICD-10-CM | POA: Diagnosis not present

## 2018-07-02 DIAGNOSIS — R413 Other amnesia: Secondary | ICD-10-CM | POA: Diagnosis not present

## 2018-07-08 DIAGNOSIS — G259 Extrapyramidal and movement disorder, unspecified: Secondary | ICD-10-CM | POA: Diagnosis not present

## 2018-07-08 DIAGNOSIS — M5126 Other intervertebral disc displacement, lumbar region: Secondary | ICD-10-CM | POA: Diagnosis not present

## 2018-07-08 DIAGNOSIS — R296 Repeated falls: Secondary | ICD-10-CM | POA: Diagnosis not present

## 2018-07-08 DIAGNOSIS — M5417 Radiculopathy, lumbosacral region: Secondary | ICD-10-CM | POA: Diagnosis not present

## 2018-07-08 DIAGNOSIS — M461 Sacroiliitis, not elsewhere classified: Secondary | ICD-10-CM | POA: Diagnosis not present

## 2018-07-08 DIAGNOSIS — M545 Low back pain: Secondary | ICD-10-CM | POA: Diagnosis not present

## 2018-07-08 DIAGNOSIS — M5137 Other intervertebral disc degeneration, lumbosacral region: Secondary | ICD-10-CM | POA: Diagnosis not present

## 2018-07-08 DIAGNOSIS — G3183 Dementia with Lewy bodies: Secondary | ICD-10-CM | POA: Diagnosis not present

## 2018-07-09 DIAGNOSIS — R338 Other retention of urine: Secondary | ICD-10-CM | POA: Diagnosis not present

## 2018-07-09 DIAGNOSIS — R5382 Chronic fatigue, unspecified: Secondary | ICD-10-CM | POA: Diagnosis not present

## 2018-07-09 DIAGNOSIS — E669 Obesity, unspecified: Secondary | ICD-10-CM | POA: Diagnosis not present

## 2018-07-09 DIAGNOSIS — E785 Hyperlipidemia, unspecified: Secondary | ICD-10-CM | POA: Diagnosis not present

## 2018-07-09 DIAGNOSIS — I10 Essential (primary) hypertension: Secondary | ICD-10-CM | POA: Diagnosis not present

## 2018-07-09 DIAGNOSIS — G894 Chronic pain syndrome: Secondary | ICD-10-CM | POA: Diagnosis not present

## 2018-07-09 DIAGNOSIS — R7301 Impaired fasting glucose: Secondary | ICD-10-CM | POA: Diagnosis not present

## 2018-07-09 DIAGNOSIS — M79673 Pain in unspecified foot: Secondary | ICD-10-CM | POA: Diagnosis not present

## 2018-07-09 DIAGNOSIS — R532 Functional quadriplegia: Secondary | ICD-10-CM | POA: Diagnosis not present

## 2018-07-09 DIAGNOSIS — G20 Parkinson's disease: Secondary | ICD-10-CM | POA: Diagnosis not present

## 2018-07-09 DIAGNOSIS — D62 Acute posthemorrhagic anemia: Secondary | ICD-10-CM | POA: Diagnosis not present

## 2018-07-09 DIAGNOSIS — N401 Enlarged prostate with lower urinary tract symptoms: Secondary | ICD-10-CM | POA: Diagnosis not present

## 2018-07-09 DIAGNOSIS — M48061 Spinal stenosis, lumbar region without neurogenic claudication: Secondary | ICD-10-CM | POA: Diagnosis not present

## 2018-07-09 DIAGNOSIS — K210 Gastro-esophageal reflux disease with esophagitis: Secondary | ICD-10-CM | POA: Diagnosis not present

## 2018-07-09 DIAGNOSIS — E291 Testicular hypofunction: Secondary | ICD-10-CM | POA: Diagnosis not present

## 2018-07-09 DIAGNOSIS — E039 Hypothyroidism, unspecified: Secondary | ICD-10-CM | POA: Diagnosis not present

## 2018-07-09 DIAGNOSIS — G3183 Dementia with Lewy bodies: Secondary | ICD-10-CM | POA: Diagnosis not present

## 2018-07-09 DIAGNOSIS — R69 Illness, unspecified: Secondary | ICD-10-CM | POA: Diagnosis not present

## 2018-07-16 DIAGNOSIS — M5387 Other specified dorsopathies, lumbosacral region: Secondary | ICD-10-CM | POA: Diagnosis not present

## 2018-07-16 DIAGNOSIS — M545 Low back pain: Secondary | ICD-10-CM | POA: Diagnosis not present

## 2018-07-16 DIAGNOSIS — M461 Sacroiliitis, not elsewhere classified: Secondary | ICD-10-CM | POA: Diagnosis not present

## 2018-07-16 DIAGNOSIS — M259 Joint disorder, unspecified: Secondary | ICD-10-CM | POA: Diagnosis not present

## 2018-07-16 DIAGNOSIS — M47817 Spondylosis without myelopathy or radiculopathy, lumbosacral region: Secondary | ICD-10-CM | POA: Diagnosis not present

## 2018-07-24 DIAGNOSIS — G259 Extrapyramidal and movement disorder, unspecified: Secondary | ICD-10-CM | POA: Diagnosis not present

## 2018-07-24 DIAGNOSIS — S39012D Strain of muscle, fascia and tendon of lower back, subsequent encounter: Secondary | ICD-10-CM | POA: Diagnosis not present

## 2018-07-24 DIAGNOSIS — M461 Sacroiliitis, not elsewhere classified: Secondary | ICD-10-CM | POA: Diagnosis not present

## 2018-07-24 DIAGNOSIS — B0229 Other postherpetic nervous system involvement: Secondary | ICD-10-CM | POA: Diagnosis not present

## 2018-07-24 DIAGNOSIS — M5137 Other intervertebral disc degeneration, lumbosacral region: Secondary | ICD-10-CM | POA: Diagnosis not present

## 2018-07-24 DIAGNOSIS — M545 Low back pain: Secondary | ICD-10-CM | POA: Diagnosis not present

## 2018-07-24 DIAGNOSIS — M5417 Radiculopathy, lumbosacral region: Secondary | ICD-10-CM | POA: Diagnosis not present

## 2018-07-24 DIAGNOSIS — R296 Repeated falls: Secondary | ICD-10-CM | POA: Diagnosis not present

## 2018-07-24 DIAGNOSIS — G3183 Dementia with Lewy bodies: Secondary | ICD-10-CM | POA: Diagnosis not present

## 2018-07-24 DIAGNOSIS — M5126 Other intervertebral disc displacement, lumbar region: Secondary | ICD-10-CM | POA: Diagnosis not present

## 2018-08-13 DIAGNOSIS — R296 Repeated falls: Secondary | ICD-10-CM | POA: Diagnosis not present

## 2018-08-13 DIAGNOSIS — M5417 Radiculopathy, lumbosacral region: Secondary | ICD-10-CM | POA: Diagnosis not present

## 2018-08-13 DIAGNOSIS — M5137 Other intervertebral disc degeneration, lumbosacral region: Secondary | ICD-10-CM | POA: Diagnosis not present

## 2018-08-13 DIAGNOSIS — M545 Low back pain: Secondary | ICD-10-CM | POA: Diagnosis not present

## 2018-08-13 DIAGNOSIS — M461 Sacroiliitis, not elsewhere classified: Secondary | ICD-10-CM | POA: Diagnosis not present

## 2018-08-13 DIAGNOSIS — G259 Extrapyramidal and movement disorder, unspecified: Secondary | ICD-10-CM | POA: Diagnosis not present

## 2018-08-13 DIAGNOSIS — B0229 Other postherpetic nervous system involvement: Secondary | ICD-10-CM | POA: Diagnosis not present

## 2018-08-13 DIAGNOSIS — G3183 Dementia with Lewy bodies: Secondary | ICD-10-CM | POA: Diagnosis not present

## 2018-08-13 DIAGNOSIS — M5126 Other intervertebral disc displacement, lumbar region: Secondary | ICD-10-CM | POA: Diagnosis not present

## 2018-09-12 DIAGNOSIS — M5417 Radiculopathy, lumbosacral region: Secondary | ICD-10-CM | POA: Diagnosis not present

## 2018-09-12 DIAGNOSIS — M5126 Other intervertebral disc displacement, lumbar region: Secondary | ICD-10-CM | POA: Diagnosis not present

## 2018-09-12 DIAGNOSIS — R69 Illness, unspecified: Secondary | ICD-10-CM | POA: Diagnosis not present

## 2018-09-12 DIAGNOSIS — M461 Sacroiliitis, not elsewhere classified: Secondary | ICD-10-CM | POA: Diagnosis not present

## 2018-09-12 DIAGNOSIS — G3183 Dementia with Lewy bodies: Secondary | ICD-10-CM | POA: Diagnosis not present

## 2018-09-12 DIAGNOSIS — G259 Extrapyramidal and movement disorder, unspecified: Secondary | ICD-10-CM | POA: Diagnosis not present

## 2018-09-12 DIAGNOSIS — M545 Low back pain: Secondary | ICD-10-CM | POA: Diagnosis not present

## 2018-09-12 DIAGNOSIS — M5137 Other intervertebral disc degeneration, lumbosacral region: Secondary | ICD-10-CM | POA: Diagnosis not present

## 2018-09-12 DIAGNOSIS — B0229 Other postherpetic nervous system involvement: Secondary | ICD-10-CM | POA: Diagnosis not present

## 2018-09-12 DIAGNOSIS — R296 Repeated falls: Secondary | ICD-10-CM | POA: Diagnosis not present

## 2018-09-12 DIAGNOSIS — Z79899 Other long term (current) drug therapy: Secondary | ICD-10-CM | POA: Diagnosis not present

## 2018-10-02 ENCOUNTER — Encounter: Payer: Self-pay | Admitting: Gastroenterology

## 2018-11-11 ENCOUNTER — Other Ambulatory Visit: Payer: Self-pay | Admitting: Gastroenterology

## 2020-10-19 DEATH — deceased

## 2021-05-26 ENCOUNTER — Encounter: Payer: Self-pay | Admitting: *Deleted
